# Patient Record
Sex: Female | Born: 1990 | Race: White | Hispanic: No | Marital: Married | Smoking: Never smoker
Health system: Southern US, Community
[De-identification: ages and names within clinical notes are randomized; demographics above are authoritative.]

## PROBLEM LIST (undated history)

## (undated) DIAGNOSIS — A6009 Herpesviral infection of other urogenital tract: Secondary | ICD-10-CM

## (undated) HISTORY — PX: NO PAST SURGERIES: SHX2092

## (undated) HISTORY — PX: WISDOM TOOTH EXTRACTION: SHX21

---

## 2019-05-15 DIAGNOSIS — F411 Generalized anxiety disorder: Secondary | ICD-10-CM | POA: Insufficient documentation

## 2019-06-26 IMAGING — CR DG KNEE COMPLETE 4+V*R*
1 series · 4 of 4 positions shown · non-contrast
Comparison: None.

CLINICAL DATA: Right knee pain.

EXAM:
RIGHT KNEE - COMPLETE 4+ VIEW

[Series 1: dg knee complete 4 views right · 0.14mm/px · 4 of 4 slices shown]
[im 1/4]
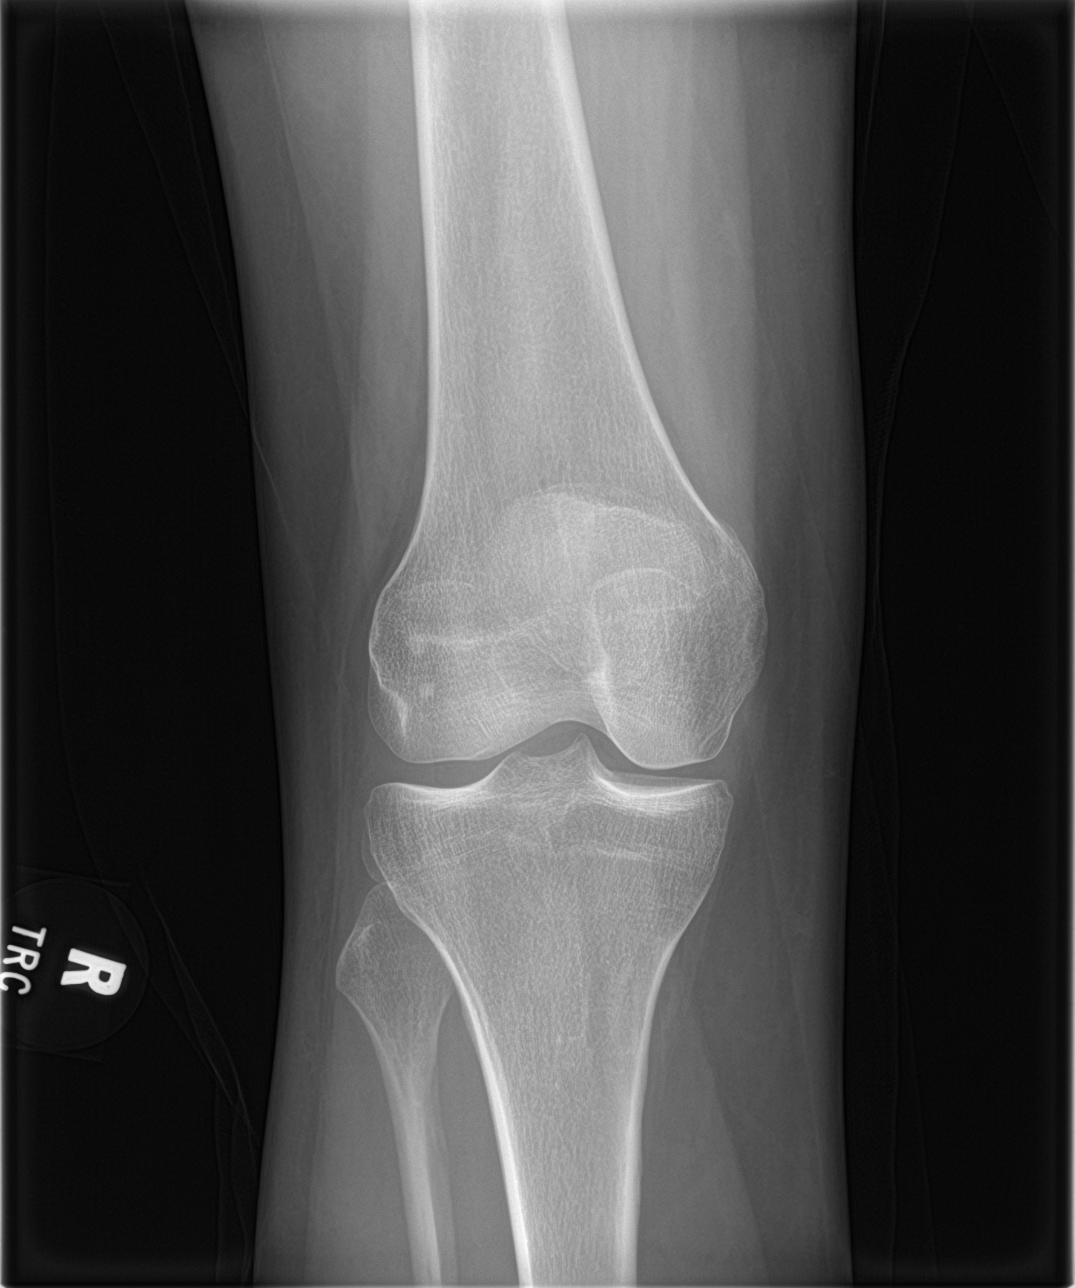
[im 2/4]
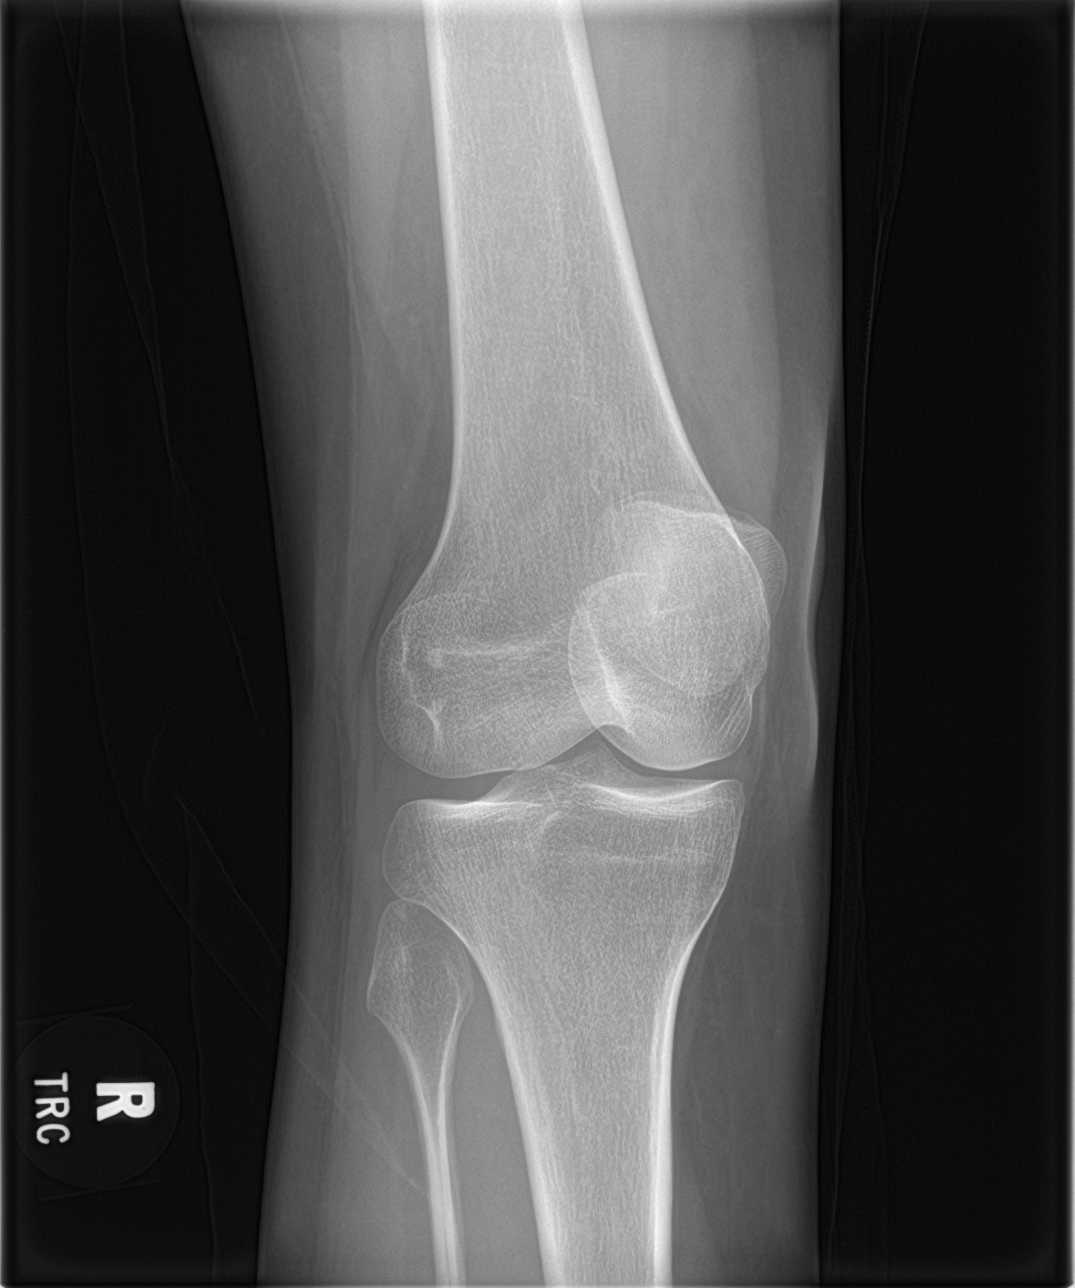
[im 3/4]
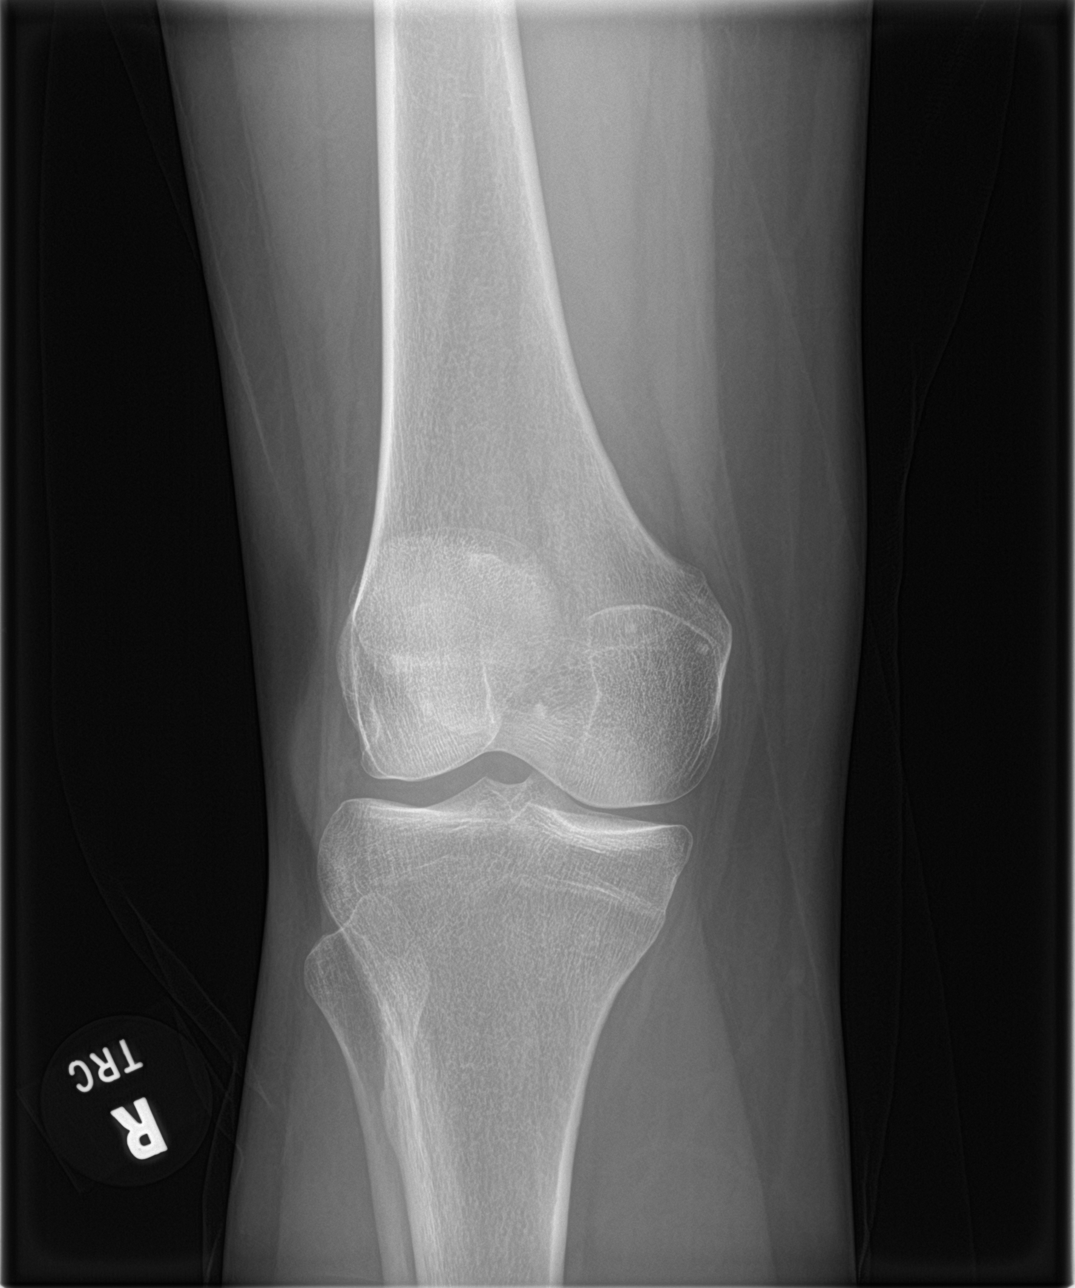
[im 4/4]
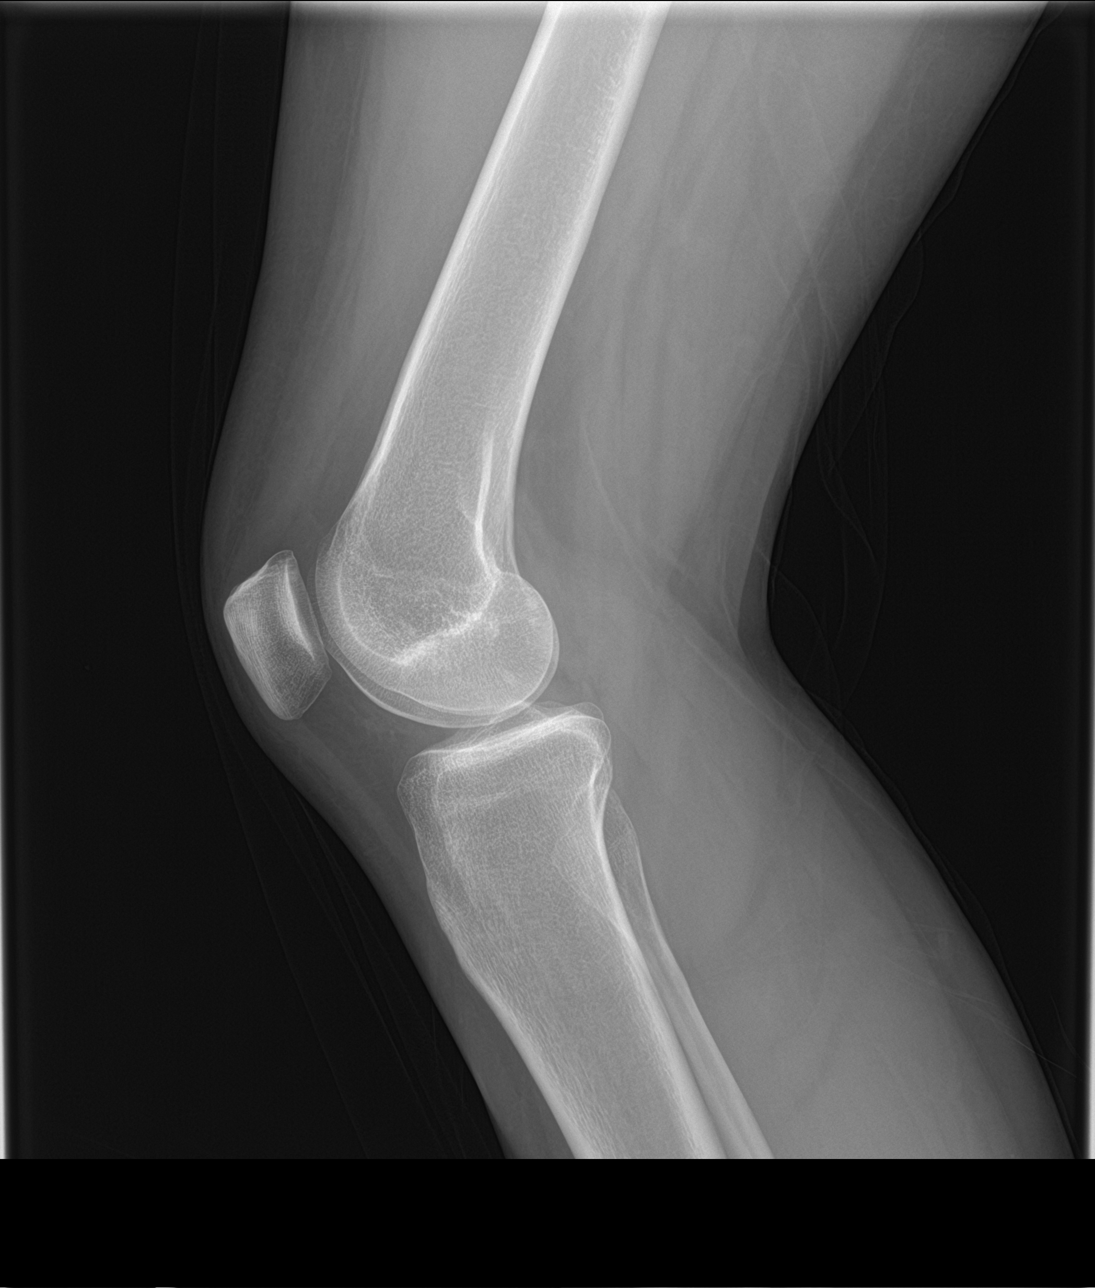

[4 of 4 positions shown; findings below may reference images not displayed]

FINDINGS: No evidence of fracture, dislocation, or joint effusion. No evidence
of arthropathy or other focal bone abnormality. Soft tissues are
unremarkable.
IMPRESSION: Negative.

## 2020-05-08 ENCOUNTER — Other Ambulatory Visit
Admission: RE | Admit: 2020-05-08 | Discharge: 2020-05-08 | Disposition: A | Discharge status: Home / Self Care | Payer: Federal, State, Local not specified - PPO | Source: Ambulatory Visit | Attending: Pediatrics | Admitting: Pediatrics

## 2020-05-08 DIAGNOSIS — M546 Pain in thoracic spine: Secondary | ICD-10-CM | POA: Insufficient documentation

## 2020-05-08 LAB — D-DIMER, QUANTITATIVE: D-Dimer, Quant: 0.32 ug/mL-FEU (ref 0.00–0.50)

## 2020-08-20 ENCOUNTER — Other Ambulatory Visit: Payer: Self-pay | Admitting: Family Medicine

## 2020-08-20 DIAGNOSIS — M5441 Lumbago with sciatica, right side: Secondary | ICD-10-CM

## 2020-08-20 DIAGNOSIS — M5414 Radiculopathy, thoracic region: Secondary | ICD-10-CM

## 2020-08-20 DIAGNOSIS — M5134 Other intervertebral disc degeneration, thoracic region: Secondary | ICD-10-CM

## 2020-08-27 ENCOUNTER — Ambulatory Visit
Admission: RE | Admit: 2020-08-27 | Discharge: 2020-08-27 | Disposition: A | Discharge status: Home / Self Care | Payer: Federal, State, Local not specified - PPO | Source: Ambulatory Visit | Attending: Family Medicine | Admitting: Family Medicine

## 2020-08-27 ENCOUNTER — Other Ambulatory Visit: Payer: Self-pay

## 2020-08-27 DIAGNOSIS — M5134 Other intervertebral disc degeneration, thoracic region: Secondary | ICD-10-CM

## 2020-08-27 DIAGNOSIS — M5414 Radiculopathy, thoracic region: Secondary | ICD-10-CM

## 2020-08-27 DIAGNOSIS — G8929 Other chronic pain: Secondary | ICD-10-CM

## 2020-08-27 IMAGING — MR MR LUMBAR SPINE W/O CM
4 of 5 series · 26 of 48 positions shown · non-contrast
Comparison: Report from radiographs of the lumbar spine [DATE]
(images unavailable).

CLINICAL DATA: Chronic right-sided low back pain with right-sided
sciatica. Additional history provided: Patient reports sharp pain on
left side of back for 4-5 months. Right leg and buttock numbness.

EXAM:
MRI LUMBAR SPINE WITHOUT CONTRAST
TECHNIQUE: Multiplanar, multisequence MR imaging of the lumbar spine was
performed. No intravenous contrast was administered.

[Series 3: T2 · sagittal · 4.0mm · 1.09mm/px · 6 of 17 slices shown (1 of 2)]
[im 1/17]
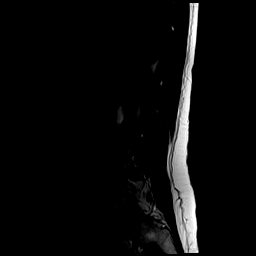
[im 4/17]
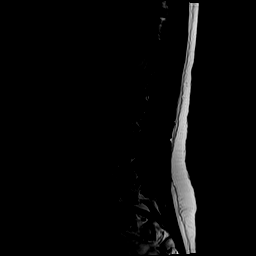
[im 7/17]
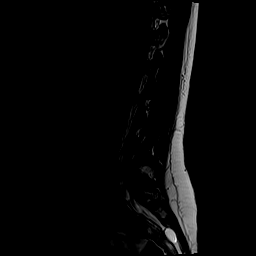
[im 10/17]
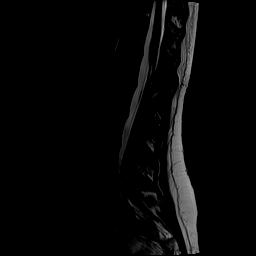
[im 13/17]
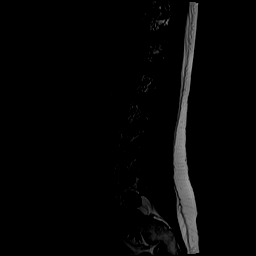
[im 17/17]
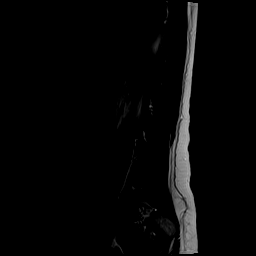

[Series 5: T1 · sagittal · 4.0mm · 1.09mm/px · 6 of 17 slices shown (1 of 2)]
[im 1/17]
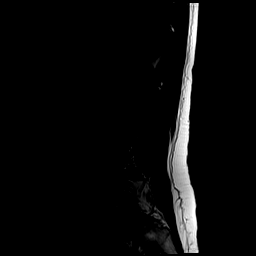
[im 4/17]
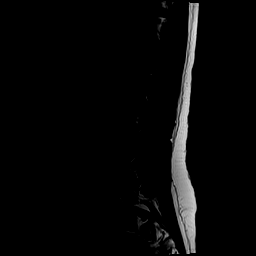
[im 7/17]
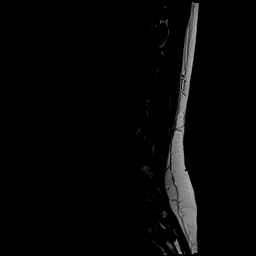
[im 10/17]
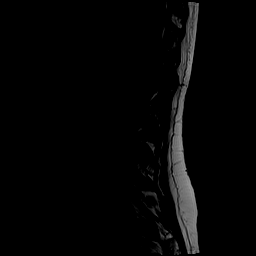
[im 13/17]
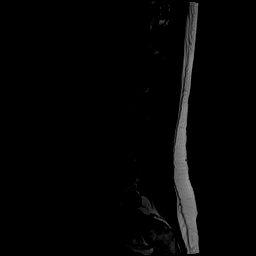
[im 17/17]
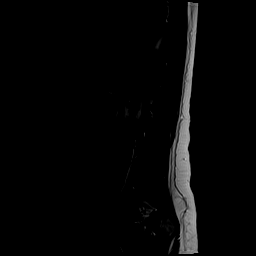

[Series 6: T2 · axial · 4.0mm · 0.39mm/px · z∈[-44,+181]mm · 9 of 42 slices shown (2 of 2)]
[im 1/42]
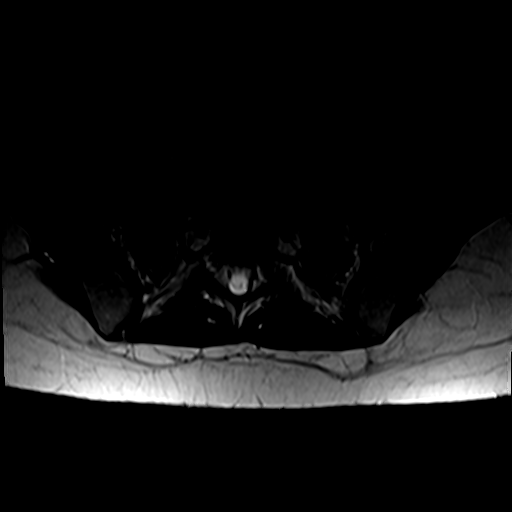
[im 6/42]
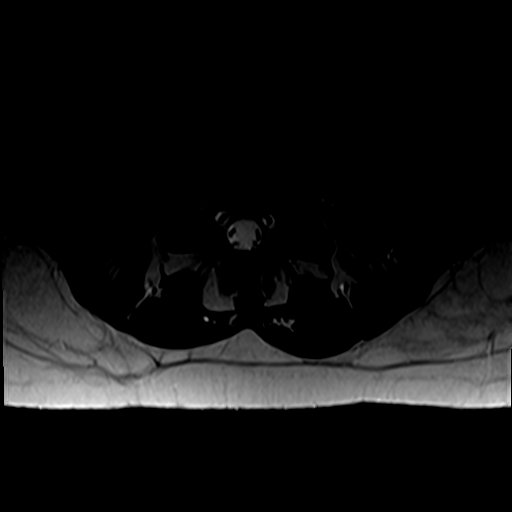
[im 12/42]
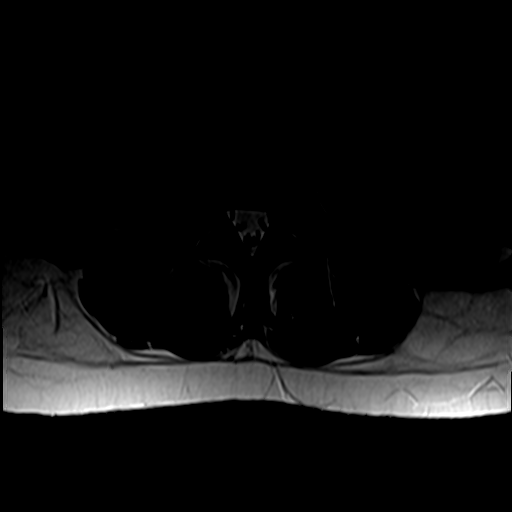
[im 18/42]
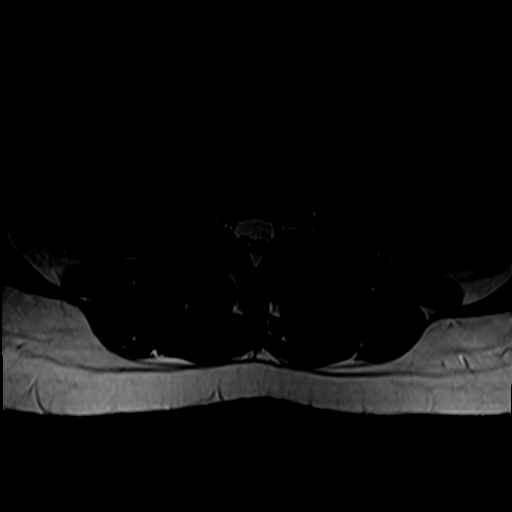
[im 21/42]
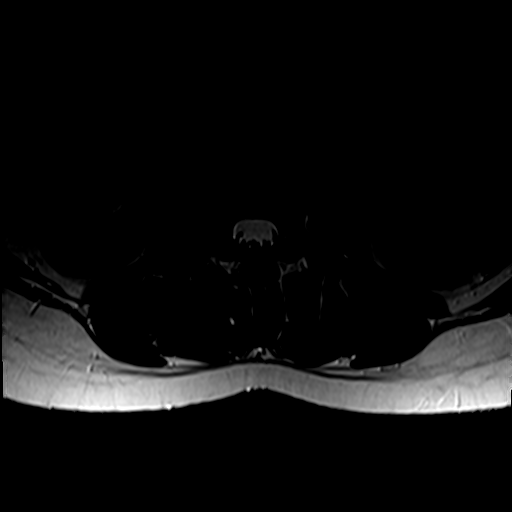
[im 24/42]
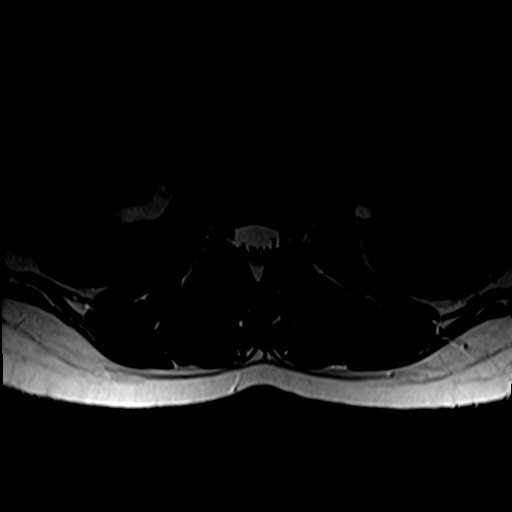
[im 30/42]
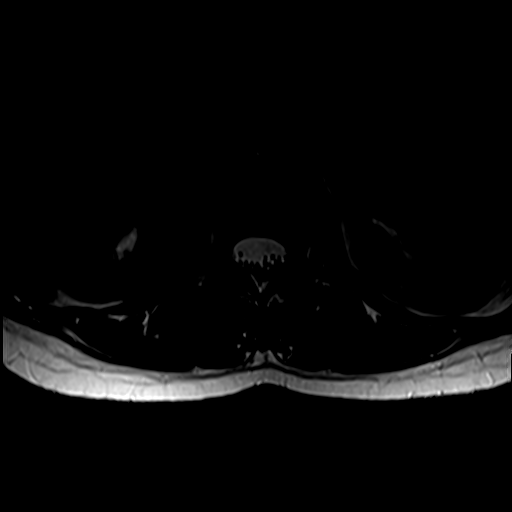
[im 36/42]
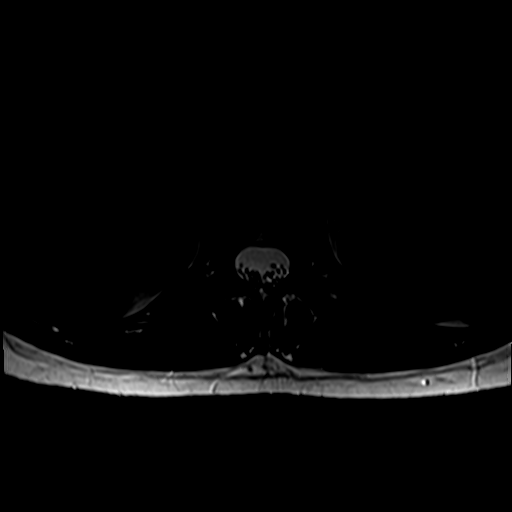
[im 42/42]
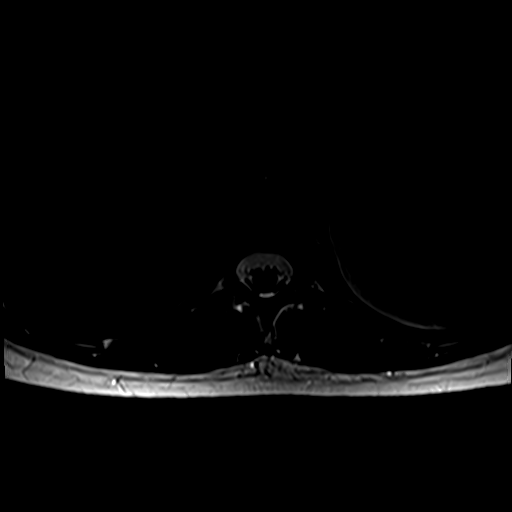

[Series 7: T1 · axial · 4.0mm · 0.39mm/px · z∈[-44,+152]mm · 5 of 42 slices shown (2 of 2)]
[im 1/42]
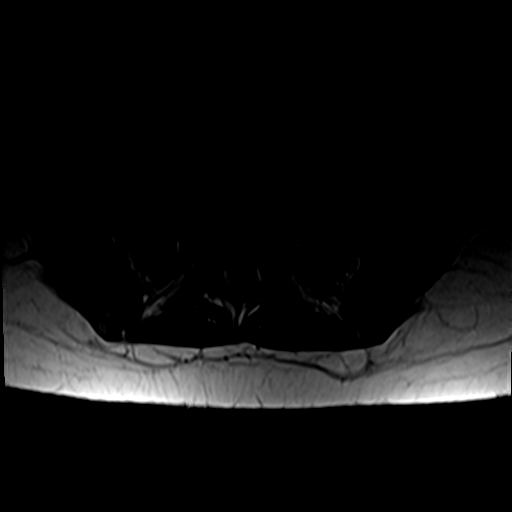
[im 6/42]
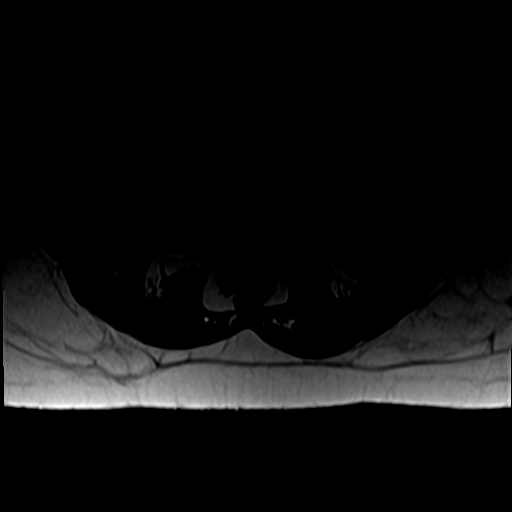
[im 12/42]
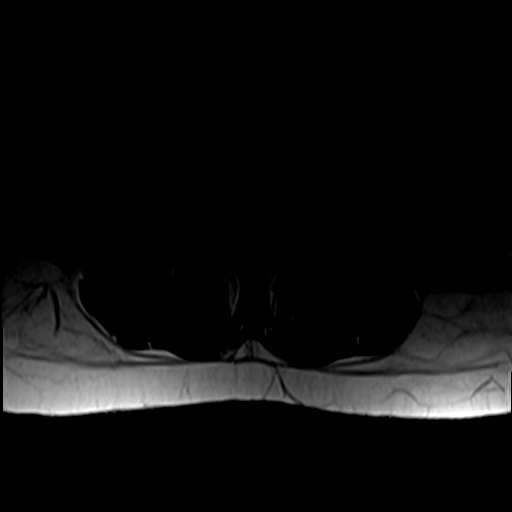
[im 21/42]
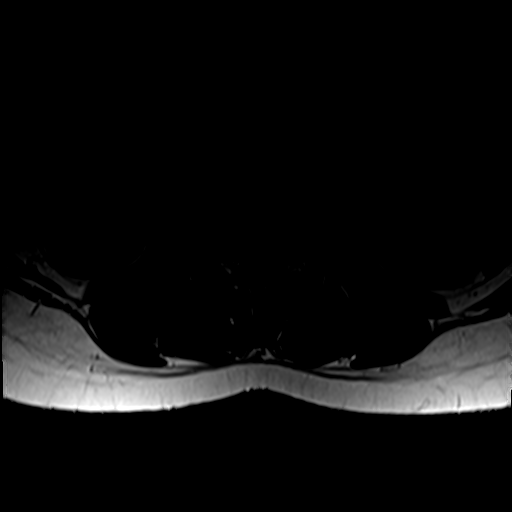
[im 36/42]
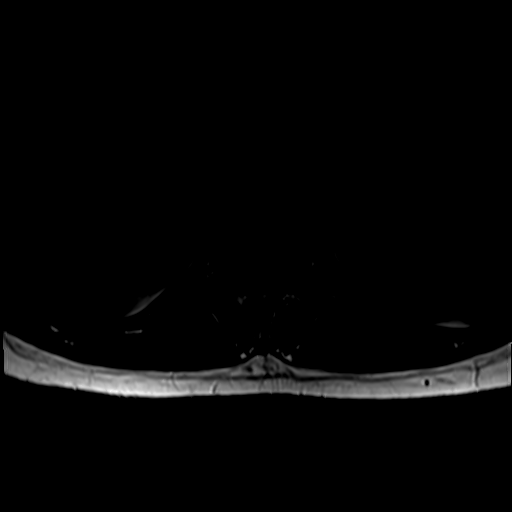

[26 of 48 positions shown; findings below may reference images not displayed]

FINDINGS: Segmentation: For the purposes of this dictation, five lumbar
vertebrae are assumed and the caudal most well-formed intervertebral
disc is designated L5-S1.

Alignment: Mild lumbar dextrocurvature, possibly positional. Trace
L1-L2, L2-L3 and L3-L4 grade 1 retrolisthesis.

Vertebrae: Vertebral body height is maintained. No significant
marrow edema or focal suspicious osseous lesion. Focal fat versus
small hemangioma within the L4 vertebral body.

Conus medullaris and cauda equina: Conus extends to the L1 level. No
signal abnormality within the visualized distal spinal cord.

Paraspinal and other soft tissues: No abnormality identified within
included portions of the abdomen/retroperitoneum. Paraspinal soft
tissues within normal limits.

Disc levels:

Intervertebral disc height is maintained throughout the lumbar
spine.

T12-L1: No significant disc herniation or stenosis.

L1-L2: Trace grade 1 retrolisthesis. No significant disc herniation
or stenosis.

L2-L3: Trace grade 1 retrolisthesis. No significant disc herniation
or stenosis.

L3-L4: Trace grade 1 retrolisthesis. Minimal facet arthrosis and
ligamentum flavum hypertrophy. Minimal relative left subarticular
narrowing without nerve root impingement. Central canal patent. No
significant foraminal stenosis.

L4-L5: Minimal facet arthrosis and ligamentum flavum hypertrophy.
Minimal relative left subarticular narrowing without nerve root
impingement. Central canal patent. No significant foraminal
stenosis.

L5-S1: Minimal facet arthrosis. No significant disc herniation or
stenosis.
IMPRESSION: Mild lumbar spondylosis, as outlined. Minimal facet arthrosis and
ligamentum flavum hypertrophy contribute to minimal relative left
subarticular narrowing at L3-L4 and L4-L5 (without nerve root
impingement). No significant central canal stenosis. Significant
foraminal narrowing.

Trace grade 1 retrolisthesis at L1-L2, L2-L3 and L3-L4.

Slight lumbar dextrocurvature, possibly positional.

## 2020-08-27 IMAGING — MR MR THORACIC SPINE W/O CM
4 of 6 series · 21 of 48 positions shown · non-contrast
Comparison: Report from radiograph of the chest and left ribs
[DATE] (images unavailable).

CLINICAL DATA: Thoracic radiculitis. Degenerative disc disease,
thoracic. Additional history provided by scanning technologist:
Patient reports 5 months of left mid back pain.

EXAM:
MRI THORACIC SPINE WITHOUT CONTRAST
TECHNIQUE: Multiplanar, multisequence MR imaging of the thoracic spine was
performed. No intravenous contrast was administered.

[Series 17: T1 · sagittal · 3.0mm · 0.83mm/px · 3 of 15 slices shown]
[im 1/15]
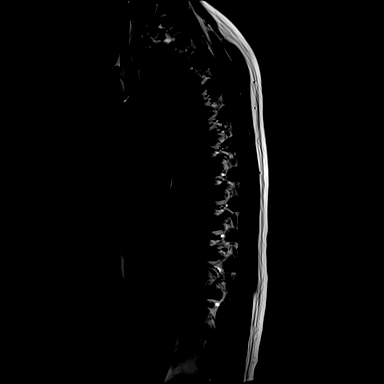
[im 8/15]
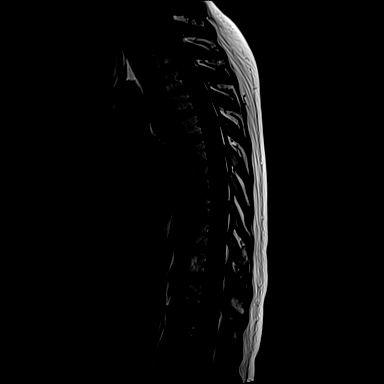
[im 15/15]
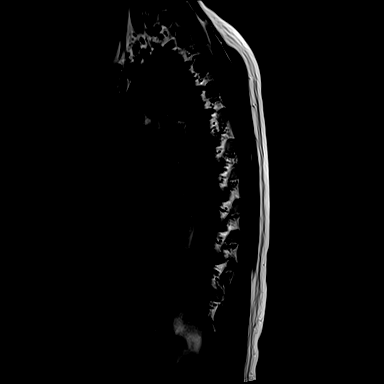

[Series 19: STIR · sagittal · 3.0mm · 1.00mm/px · 3 of 15 slices shown]
[im 3/15]
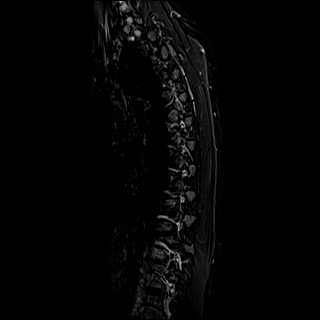
[im 9/15]
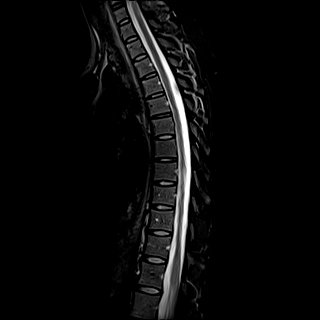
[im 15/15]
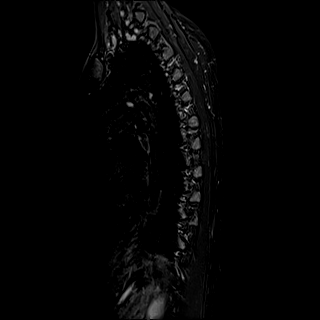

[Series 20: T2 · sagittal · 3.0mm · 0.83mm/px · 6 of 15 slices shown (1 of 2)]
[im 1/15]
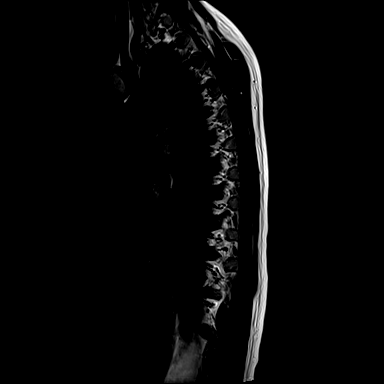
[im 3/15]
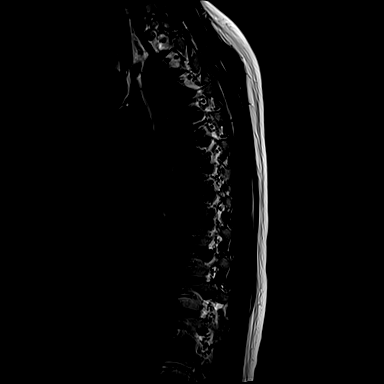
[im 6/15]
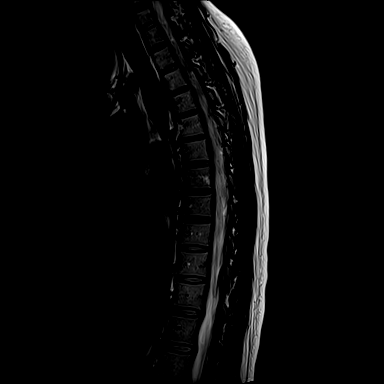
[im 9/15]
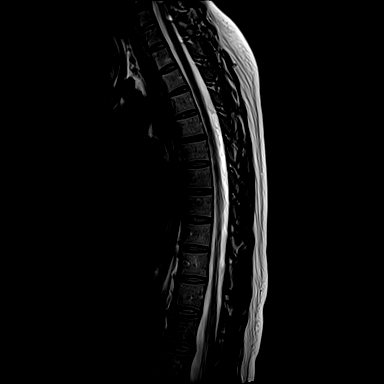
[im 12/15]
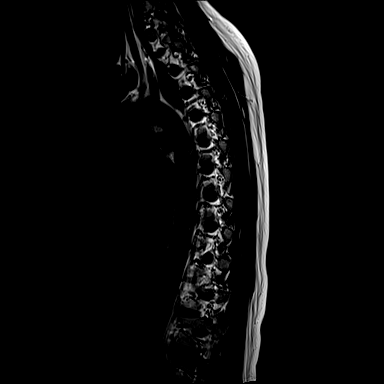
[im 15/15]
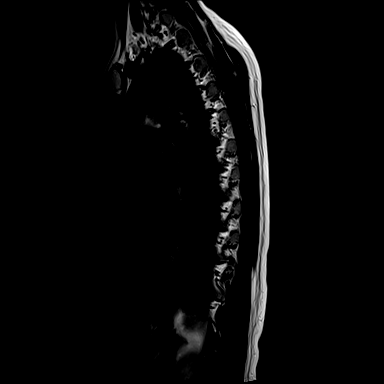

[Series 21: T2 · axial · 4.0mm · 0.28mm/px · z∈[-261,-66]mm · 9 of 33 slices shown (2 of 2)]
[im 1/33]
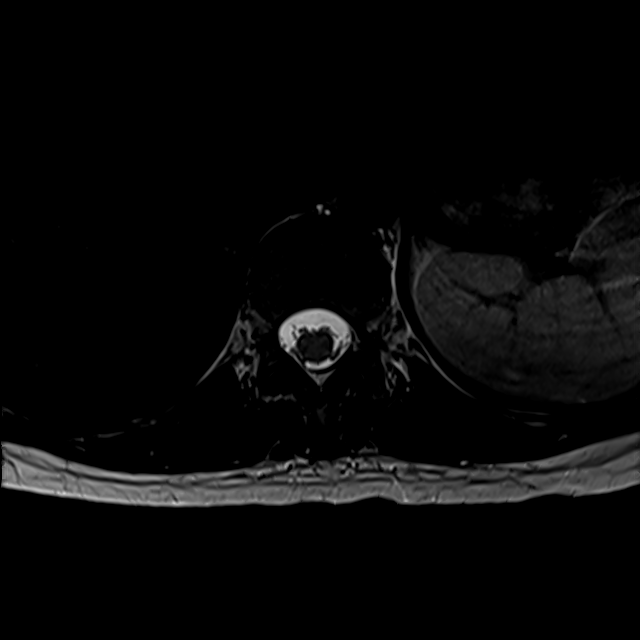
[im 6/33]
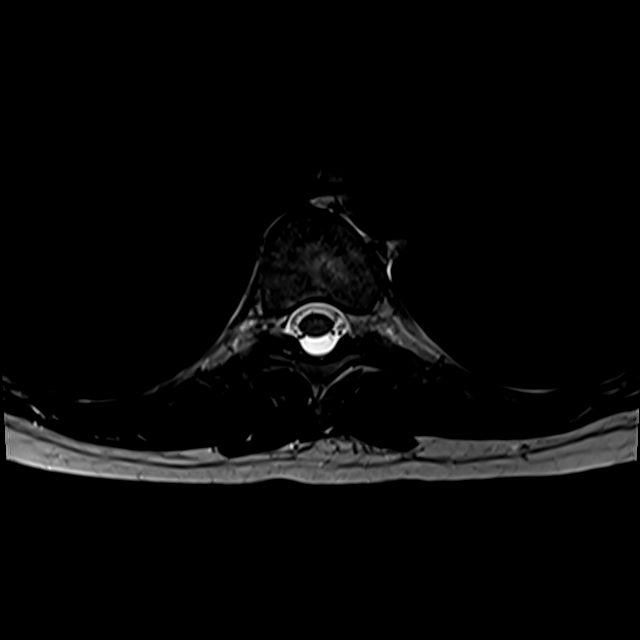
[im 11/33]
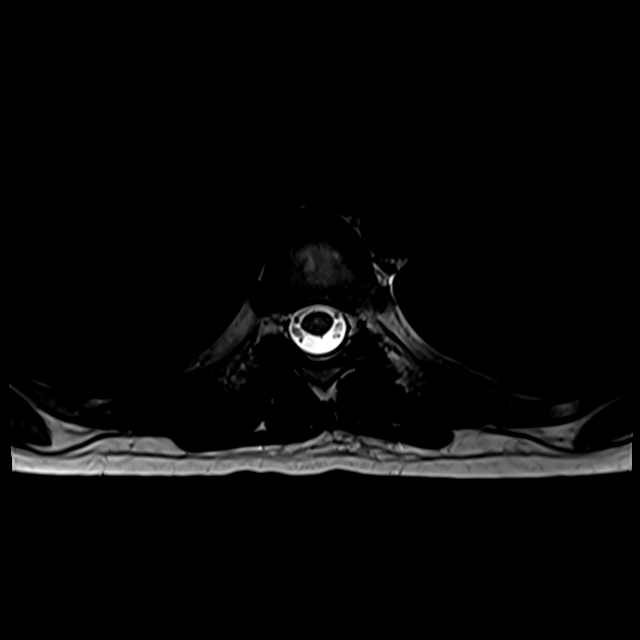
[im 14/33]
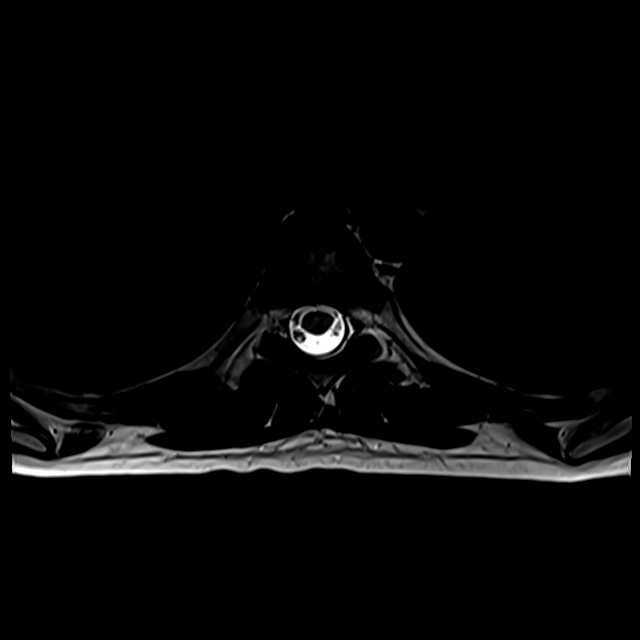
[im 17/33]
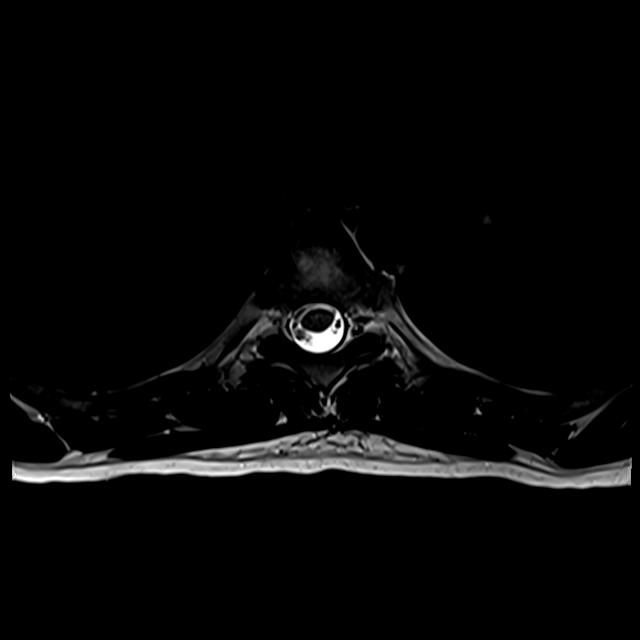
[im 19/33]
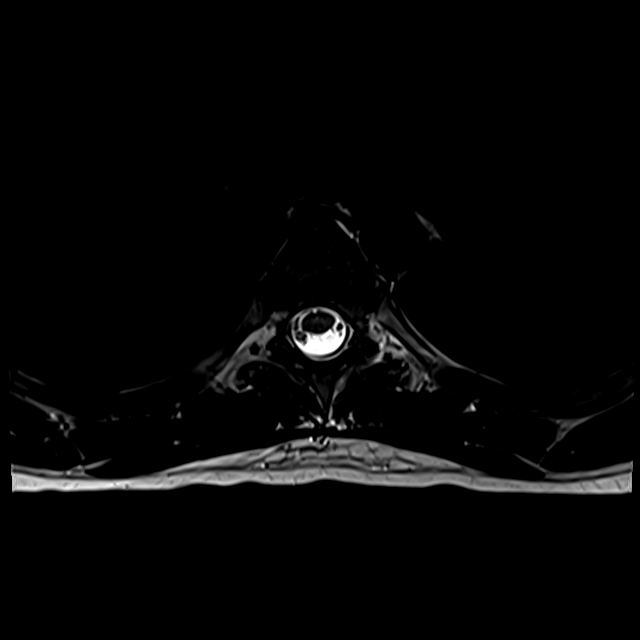
[im 22/33]
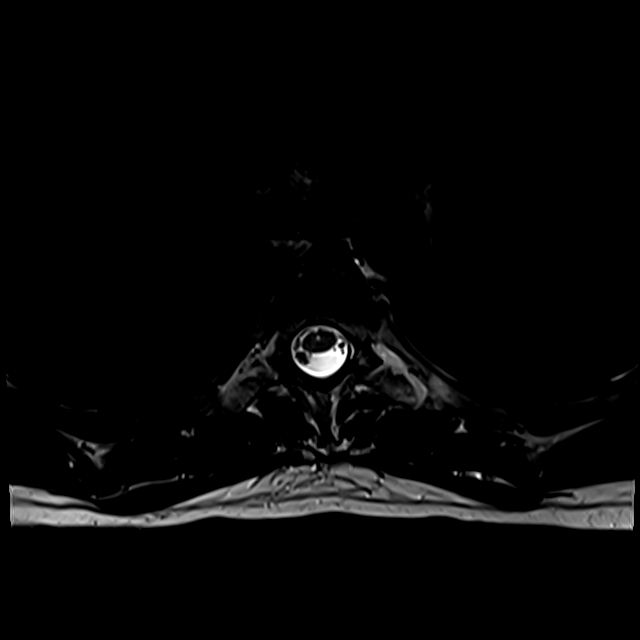
[im 27/33]
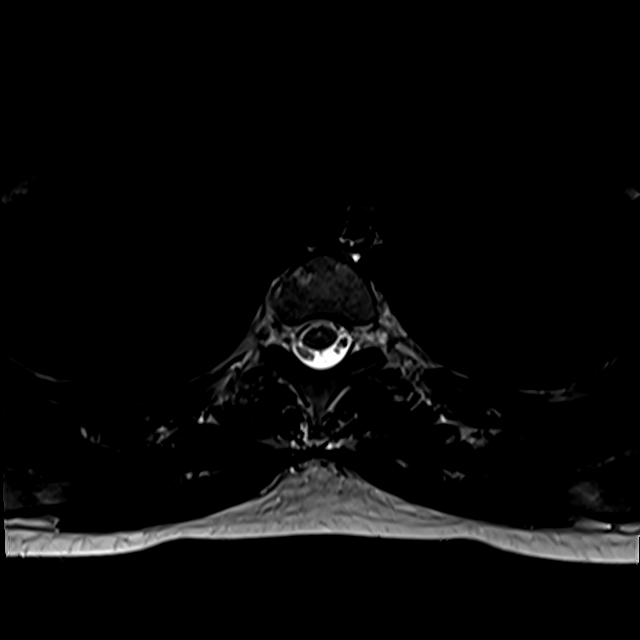
[im 33/33]
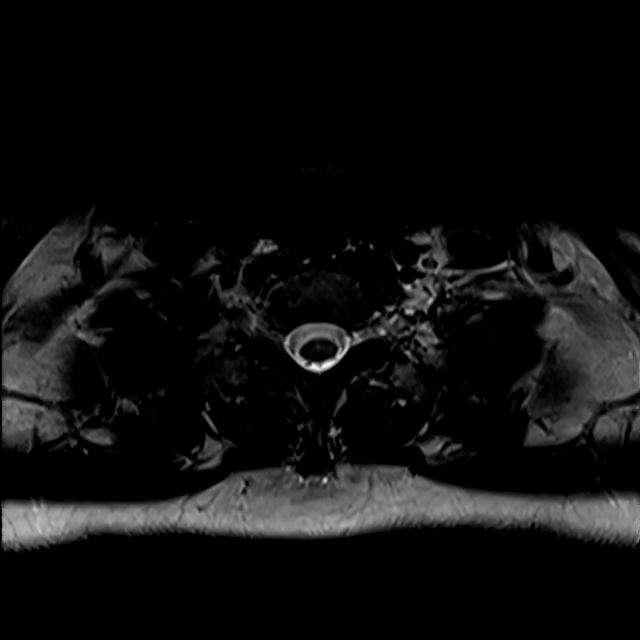

[21 of 48 positions shown; findings below may reference images not displayed]

FINDINGS: Alignment:  No significant spondylolisthesis.

Vertebrae: Vertebral body height is maintained. No significant
marrow edema or focal suspicious osseous lesion. T10 vertebral body
hemangioma.

Cord: Minimal T2 hyperintensity within the central spinal cord
(measuring 1 mm in width), likely reflecting slight prominence of
the central canal (within normal limits).

Paraspinal and other soft tissues: No abnormality identified within
included portions of the thorax or upper abdomen/retroperitoneum.

Disc levels:

Minimal multilevel disc degeneration, most notably at T6-T7 and
T7-T8. Tiny central disc protrusions at T1-T2 and T7-T8. No
significant spinal canal or foraminal stenosis.
IMPRESSION: Minimal thoracic spondylosis, as described. Most notably, there are
tiny central disc protrusions at T6-T7 and T7-T8. No significant
spinal canal or foraminal stenosis.

## 2020-08-31 ENCOUNTER — Other Ambulatory Visit: Payer: Federal, State, Local not specified - PPO

## 2021-02-25 ENCOUNTER — Other Ambulatory Visit: Payer: Self-pay

## 2021-02-25 ENCOUNTER — Emergency Department: Payer: Federal, State, Local not specified - PPO

## 2021-02-25 ENCOUNTER — Encounter: Payer: Self-pay | Admitting: *Deleted

## 2021-02-25 DIAGNOSIS — Z5321 Procedure and treatment not carried out due to patient leaving prior to being seen by health care provider: Secondary | ICD-10-CM | POA: Insufficient documentation

## 2021-02-25 DIAGNOSIS — M25561 Pain in right knee: Secondary | ICD-10-CM | POA: Diagnosis present

## 2021-02-25 NOTE — ED Triage Notes (Signed)
Pt reports pain in right knee.  No known injury.  Pt reports pain radiating into upper leg.  No swelling.  Pt alert  speech clear.

## 2021-02-26 ENCOUNTER — Emergency Department
Admission: EM | Admit: 2021-02-26 | Discharge: 2021-02-26 | Disposition: A | Discharge status: Home / Self Care | Payer: Federal, State, Local not specified - PPO | Attending: Student in an Organized Health Care Education/Training Program | Admitting: Student in an Organized Health Care Education/Training Program

## 2021-02-26 NOTE — ED Notes (Signed)
No answer when called several times from lobby 

## 2021-03-01 NOTE — L&D Delivery Note (Signed)
Operative Delivery Note At 6:40 PM a viable female was delivered via Vaginal, Vacuum Neurosurgeon) assistance.  Presentation: compound; Position: Occiput,, Anterior; Station: +4.  Verbal consent: obtained from patient.  Risks and benefits discussed in detail.  Risks include, but are not limited to the risks of anesthesia, bleeding, infection, damage to maternal tissues, fetal cephalhematoma.  There is also the risk of inability to effect vaginal delivery of the head, or shoulder dystocia that cannot be resolved by established maneuvers, leading to the need for emergency cesarean section.    APGAR: 7, 9; weight pending .   Placenta status: delivered, spontaneously, intact.   Cord: 3VC  with the following complications: none.  Cord pH: n/a  Anesthesia:  epidural Instruments: Kiwi flat Episiotomy: None Lacerations: Perineal;2nd degree;Vaginal Suture Repair: 3.0 vicryl Est. Blood Loss (mL):  700  Mom to postpartum.  Baby to Couplet care / Skin to Skin.  Called to see patient.  Mom pushed to deliver a viable female infant.  Due to maternal exhaustion and then request, after consent and notifying RNs, Peds, Anesthesia, the Kiwi flat was applied according to the standard procedure.  During one contraction with 2 gentle pulls the head was crowning. The vacuum was then removed.  Mom went on the push and with the help of a Ritgen maneuver the head followed by shoulders, which delivered without difficulty, and the rest of the body.  There was a compound hand presentation.  No nuchal cord noted.  Baby to mom's chest.  Cord clamped and cut after > 1 min delay.  No cord blood obtained.  Placenta delivered spontaneously, intact, with a 3-vessel cord.  Second degree perineal laceration repaired with 3-0 Vicryl in standard fashion.  All counts correct.  Hemostasis obtained with IV pitocin and fundal massage. EBL 700 mL.  Quantitative blood loss is still pending.  Prentice Docker, MD 12/16/2021, 7:40 PM

## 2021-04-17 ENCOUNTER — Encounter (HOSPITAL_COMMUNITY): Payer: Self-pay

## 2021-04-17 ENCOUNTER — Ambulatory Visit (HOSPITAL_COMMUNITY)
Admission: EM | Admit: 2021-04-17 | Discharge: 2021-04-17 | Disposition: A | Discharge status: Home / Self Care | Payer: Federal, State, Local not specified - PPO | Attending: Internal Medicine | Admitting: Internal Medicine

## 2021-04-17 DIAGNOSIS — J019 Acute sinusitis, unspecified: Secondary | ICD-10-CM | POA: Diagnosis not present

## 2021-04-17 NOTE — Discharge Instructions (Signed)
Saline nasal spray as needed Take tylenol as needed for pain. Humidifier and vaporub use helps with nasal congestion Please reach out to your fertility specialist about antibiotic use for sinusitis in the setting of IVF pregnancy.

## 2021-04-17 NOTE — ED Provider Notes (Signed)
[facility]    CSN: [id number] Arrival date & time: 04/17/21  1840      History   Chief Complaint Chief Complaint  Patient presents with   URI    HPI Michele Mayo is a 31 y.o. female comes to the urgent care with nasal congestion, sore throat, postnasal drainage, left ear fullness and left-sided facial pain.  Patient's symptoms started with runny nose, nasal congestion and a clear rhinorrhea 2 weeks ago.  Patient started experiencing left-sided headache over the past several days.  Nasal discharge has become purulent over the past 3 to 4 days.  She describes the headache as unilateral, persistent, transiently relieved by Tylenol.  She denies any nausea or vomiting.  No floaters in the visual field.  No history of migraines.  Patient is currently undergoing IVF treatment and she just found out that she is pregnant.  She spoke with her IVF specialist who recommended only Tylenol at this time.  The IVF specialist will evaluate the patient for implantation and hence did not want her to have any medications including antibiotics.Marland Kitchen   HPI  History reviewed. No pertinent past medical history.  There are no problems to display for this patient.   History reviewed. No pertinent surgical history.  OB History     Gravida  1   Para      Term      Preterm      AB      Living         SAB      IAB      Ectopic      Multiple      Live Births               Home Medications    Prior to Admission medications   Medication Sig Start Date End Date Taking? Authorizing Provider  aspirin 81 MG chewable tablet Chew 81 mg by mouth daily.   Yes [provider]  estradiol (ESTRACE) 2 MG tablet Take 2 mg by mouth daily.   Yes [provider]  estradiol (VIVELLE-DOT) 0.1 MG/24HR patch Place 1 patch onto the skin 2 (two) times a week.   Yes [provider]  Prenatal Vit-Fe Fumarate-FA (PRENATAL MULTIVITAMIN) TABS tablet Take 1 tablet  by mouth daily at 12 noon.   Yes [provider]  progesterone 50 MG/ML injection Inject 10 mg into the muscle daily.   Yes [provider]    Family History Family History  Family history unknown: Yes    Social History Social History   Tobacco Use   Smoking status: Never   Smokeless tobacco: Never  Substance Use Topics   Alcohol use: Not Currently     Allergies   Patient has no known allergies.   Review of Systems Review of Systems  Constitutional: Negative.  Negative for chills, fatigue and fever.  HENT:  Positive for congestion, postnasal drip, rhinorrhea and sore throat. Negative for voice change.   Respiratory:  Positive for cough. Negative for shortness of breath and wheezing.   Cardiovascular: Negative.   Gastrointestinal: Negative.   Neurological:  Positive for headaches.    Physical Exam Triage Vital Signs ED Triage Vitals  Enc Vitals Group     BP 04/17/21 1932 122/77     Pulse Rate 04/17/21 1932 79     Resp 04/17/21 1932 18     Temp 04/17/21 1932 98.8 F (37.1 C)     Temp Source 04/17/21 1932 Oral  SpO2 04/17/21 1932 100 %     Weight --      Height --      Head Circumference --      Peak Flow --      Pain Score 04/17/21 1937 4     Pain Loc --      Pain Edu? --      Excl. in GC? --    No data found.  Updated Vital Signs BP 122/77 (BP Location: Left Arm)    Pulse 79    Temp 98.8 F (37.1 C) (Oral)    Resp 18    LMP 03/21/2021 (Approximate)    SpO2 100%   Visual Acuity Right Eye Distance:   Left Eye Distance:   Bilateral Distance:    Right Eye Near:   Left Eye Near:    Bilateral Near:     Physical Exam Vitals and nursing note reviewed.  Constitutional:      General: She is not in acute distress.    Appearance: Normal appearance. She is not ill-appearing.  HENT:     Right Ear: Tympanic membrane normal.     Left Ear: Tympanic membrane normal.     Ears:     Comments: Bilateral middle ear effusion    Mouth/Throat:      Mouth: Mucous membranes are moist.     Pharynx: Posterior oropharyngeal erythema present.  Cardiovascular:     Rate and Rhythm: Normal rate and regular rhythm.  Pulmonary:     Effort: Pulmonary effort is normal.     Breath sounds: Normal breath sounds.  Neurological:     Mental Status: She is alert.     UC Treatments / Results  Labs (all labs ordered are listed, but only abnormal results are displayed) Labs Reviewed - No data to display  EKG   Radiology No results found.  Procedures Procedures (including critical care time)  Medications Ordered in UC Medications - No data to display  Initial Impression / Assessment and Plan / UC Course  I have reviewed the triage vital signs and the nursing notes.  Pertinent labs & imaging results that were available during my care of the patient were reviewed by me and considered in my medical decision making (see chart for details).     1.  Acute sinusitis with symptoms greater than 10 days: Patient is advised to use vapor rub and humidifier to help with nasal congestion Saline nasal spray or nasal rinses will help with nasal congestion Ideally, I will prescribe antibiotics as the patient's symptoms is likely secondary to bacterial sinusitis.  In the setting of IVF treatment with recent positive pregnancy test, I will hold off prescribing any antibiotics.  I encouraged the patient to call the IVF specialist in the morning to cause prescription medications for her symptoms. Final Clinical Impressions(s) / UC Diagnoses   Final diagnoses:  Acute sinusitis with symptoms > 10 days     Discharge Instructions      Saline nasal spray as needed Take tylenol as needed for pain. Humidifier and vaporub use helps with nasal congestion Please reach out to your fertility specialist about antibiotic use for sinusitis in the setting of IVF pregnancy.   ED Prescriptions   None    PDMP not reviewed this encounter.   Merrilee Jansky, MD 04/17/21 2008

## 2021-04-17 NOTE — ED Triage Notes (Signed)
Pt presents with nasal congestion, sore throat, and sinus headache X 2 weeks.

## 2021-05-10 ENCOUNTER — Emergency Department: Payer: Federal, State, Local not specified - PPO

## 2021-05-10 ENCOUNTER — Encounter: Payer: Self-pay | Admitting: Emergency Medicine

## 2021-05-10 ENCOUNTER — Other Ambulatory Visit: Payer: Self-pay

## 2021-05-10 ENCOUNTER — Emergency Department
Admission: EM | Admit: 2021-05-10 | Discharge: 2021-05-10 | Disposition: A | Discharge status: Home / Self Care | Payer: Federal, State, Local not specified - PPO | Attending: Emergency Medicine | Admitting: Emergency Medicine

## 2021-05-10 DIAGNOSIS — O418X10 Other specified disorders of amniotic fluid and membranes, first trimester, not applicable or unspecified: Secondary | ICD-10-CM

## 2021-05-10 DIAGNOSIS — O418X11 Other specified disorders of amniotic fluid and membranes, first trimester, fetus 1: Secondary | ICD-10-CM | POA: Insufficient documentation

## 2021-05-10 DIAGNOSIS — O468X1 Other antepartum hemorrhage, first trimester: Secondary | ICD-10-CM

## 2021-05-10 DIAGNOSIS — N939 Abnormal uterine and vaginal bleeding, unspecified: Secondary | ICD-10-CM

## 2021-05-10 DIAGNOSIS — O209 Hemorrhage in early pregnancy, unspecified: Secondary | ICD-10-CM | POA: Diagnosis present

## 2021-05-10 DIAGNOSIS — Z3A01 Less than 8 weeks gestation of pregnancy: Secondary | ICD-10-CM | POA: Insufficient documentation

## 2021-05-10 LAB — CBC WITH DIFFERENTIAL/PLATELET
Abs Immature Granulocytes: 0.01 10*3/uL (ref 0.00–0.07)
Basophils Absolute: 0 10*3/uL (ref 0.0–0.1)
Basophils Relative: 0 %
Eosinophils Absolute: 0 10*3/uL (ref 0.0–0.5)
Eosinophils Relative: 1 %
HCT: 40.2 % (ref 36.0–46.0)
Hemoglobin: 13.3 g/dL (ref 12.0–15.0)
Immature Granulocytes: 0 %
Lymphocytes Relative: 30 %
Lymphs Abs: 1.2 10*3/uL (ref 0.7–4.0)
MCH: 29.2 pg (ref 26.0–34.0)
MCHC: 33.1 g/dL (ref 30.0–36.0)
MCV: 88.4 fL (ref 80.0–100.0)
Monocytes Absolute: 0.2 10*3/uL (ref 0.1–1.0)
Monocytes Relative: 5 %
Neutro Abs: 2.5 10*3/uL (ref 1.7–7.7)
Neutrophils Relative %: 64 %
Platelets: 175 10*3/uL (ref 150–400)
RBC: 4.55 MIL/uL (ref 3.87–5.11)
RDW: 12.6 % (ref 11.5–15.5)
WBC: 4 10*3/uL (ref 4.0–10.5)
nRBC: 0 % (ref 0.0–0.2)

## 2021-05-10 LAB — COMPREHENSIVE METABOLIC PANEL
ALT: 10 U/L (ref 0–44)
AST: 16 U/L (ref 15–41)
Albumin: 3.6 g/dL (ref 3.5–5.0)
Alkaline Phosphatase: 45 U/L (ref 38–126)
Anion gap: 7 (ref 5–15)
BUN: 8 mg/dL (ref 6–20)
CO2: 25 mmol/L (ref 22–32)
Calcium: 9 mg/dL (ref 8.9–10.3)
Chloride: 105 mmol/L (ref 98–111)
Creatinine, Ser: 0.54 mg/dL (ref 0.44–1.00)
GFR, Estimated: >60 mL/min (ref >60)
Glucose, Bld: 91 mg/dL (ref 70–99)
Potassium: 3.9 mmol/L (ref 3.5–5.1)
Sodium: 137 mmol/L (ref 135–145)
Total Bilirubin: 1 mg/dL (ref 0.3–1.2)
Total Protein: 7.1 g/dL (ref 6.5–8.1)

## 2021-05-10 LAB — URINALYSIS, COMPLETE (UACMP) WITH MICROSCOPIC
Bilirubin Urine: NEGATIVE
Glucose, UA: NEGATIVE mg/dL
Ketones, ur: 20 mg/dL — AB
Leukocytes,Ua: NEGATIVE
Nitrite: NEGATIVE
Protein, ur: NEGATIVE mg/dL
Specific Gravity, Urine: 1.025 (ref 1.005–1.030)
pH: 6 (ref 5.0–8.0)

## 2021-05-10 LAB — HCG, QUANTITATIVE, PREGNANCY: hCG, Beta Chain, Quant, S: 102139 m[IU]/mL — ABNORMAL HIGH (ref <5)

## 2021-05-10 LAB — ABO/RH: ABO/RH(D): A POS

## 2021-05-10 LAB — SAMPLE TO BLOOD BANK

## 2021-05-10 LAB — POC URINE PREG, ED: Preg Test, Ur: POSITIVE — AB

## 2021-05-10 IMAGING — US US OB < 14 WEEKS - US OB TV
1 series · 14 of 28 positions shown · non-contrast
Comparison: None.

CLINICAL DATA: Vaginal bleeding and first-trimester pregnancy

EXAM:
OBSTETRIC <14 WK US AND TRANSVAGINAL OB US
TECHNIQUE: Both transabdominal and transvaginal ultrasound examinations were
performed for complete evaluation of the gestation as well as the
maternal uterus, adnexal regions, and pelvic cul-de-sac.
Transvaginal technique was performed to assess early pregnancy.

[Series 1: us ob comp less 14 wks · 14 of 113 slices shown]
[im 5/113]
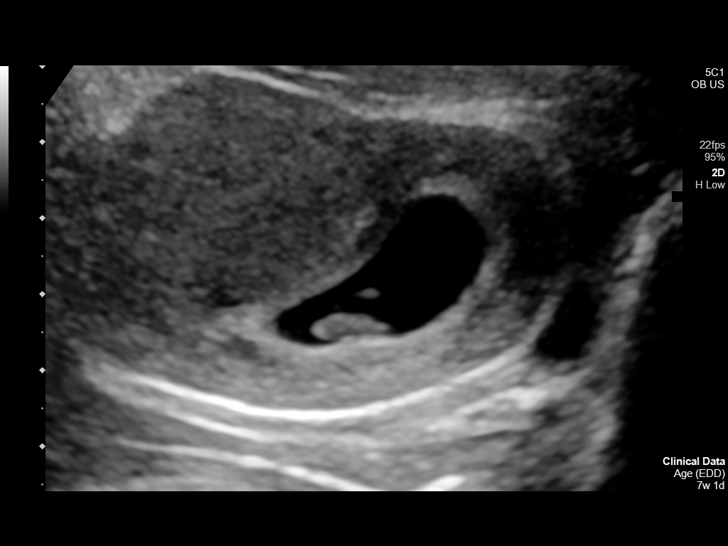
[im 13/113]
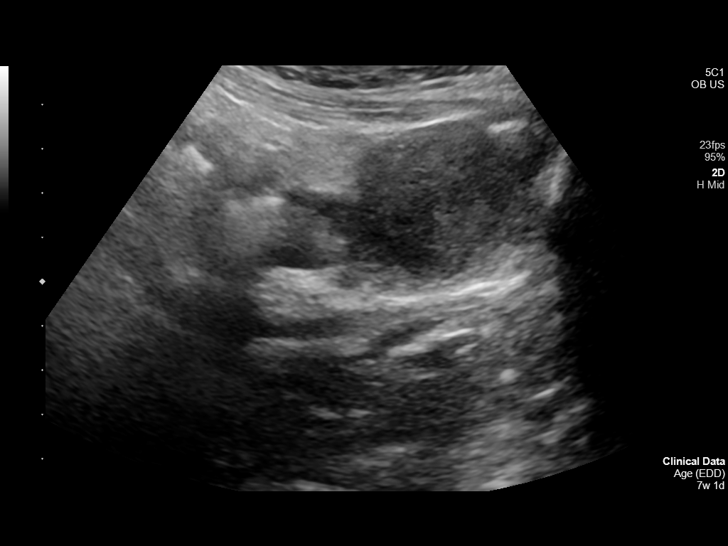
[im 21/113]
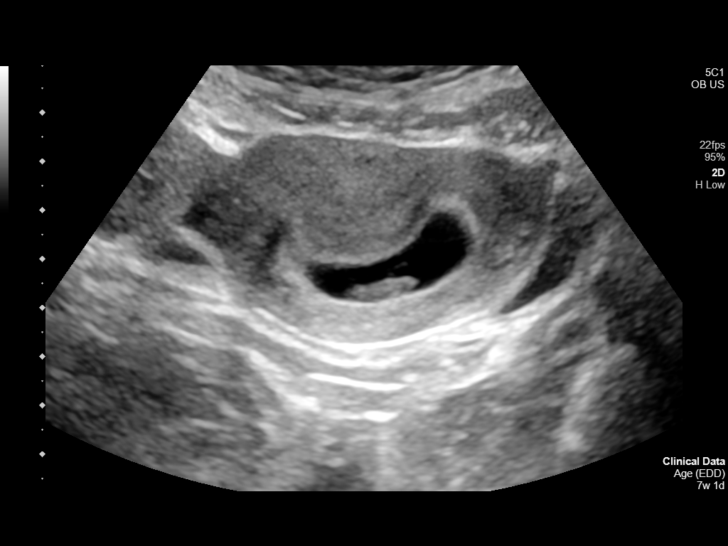
[im 30/113]
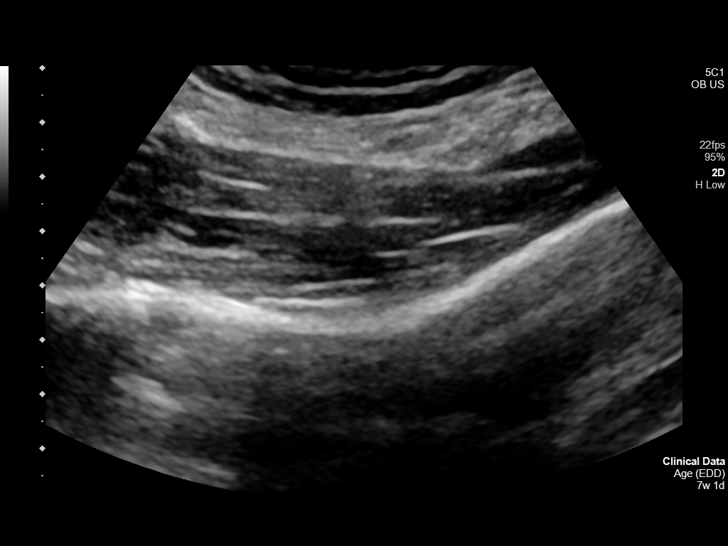
[im 38/113]
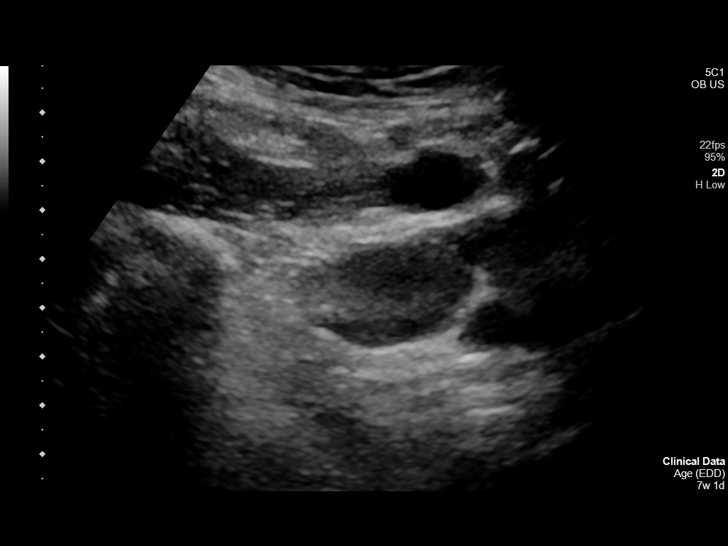
[im 46/113]
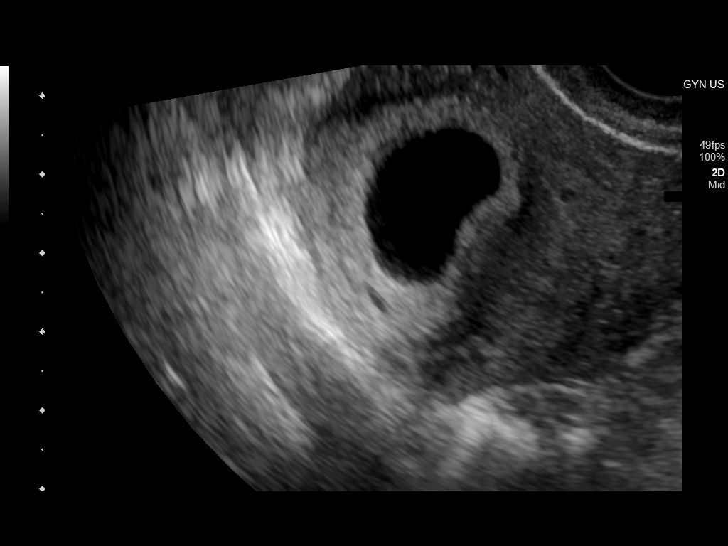
[im 54/113]
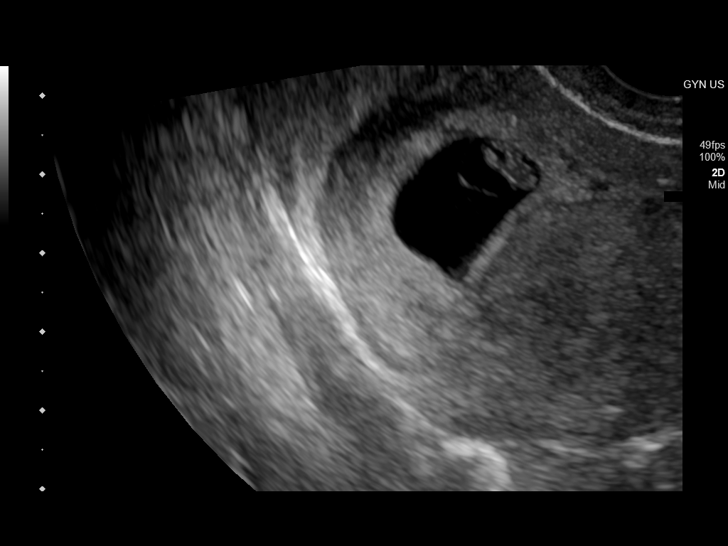
[im 63/113]
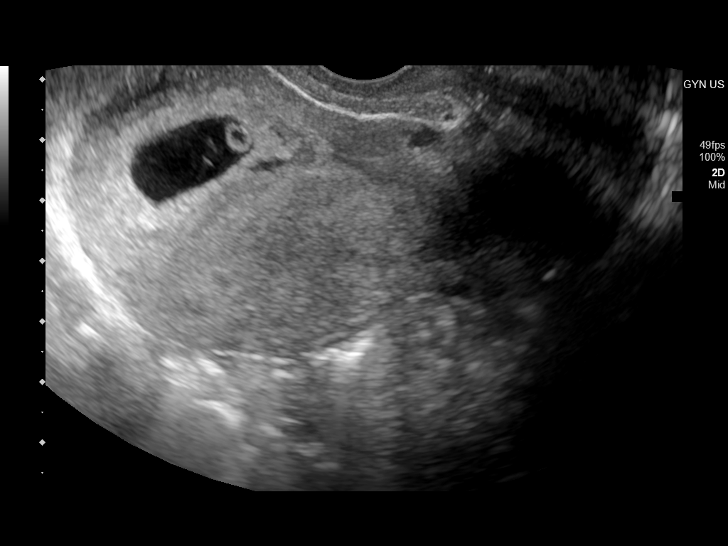
[im 71/113]
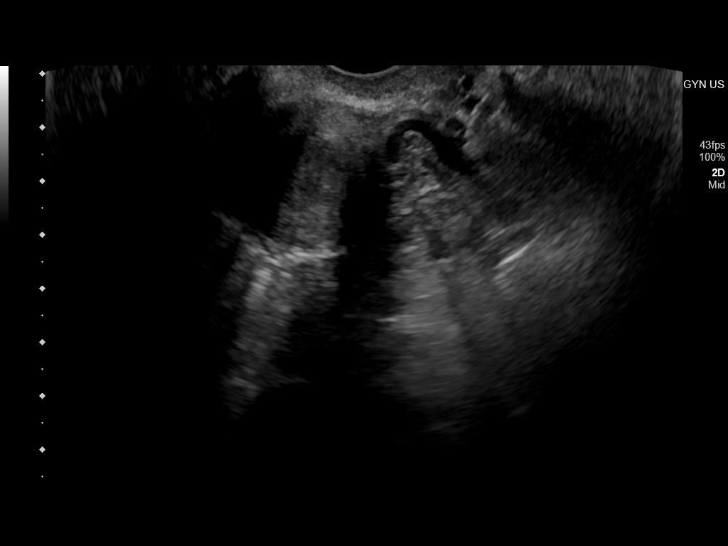
[im 79/113]
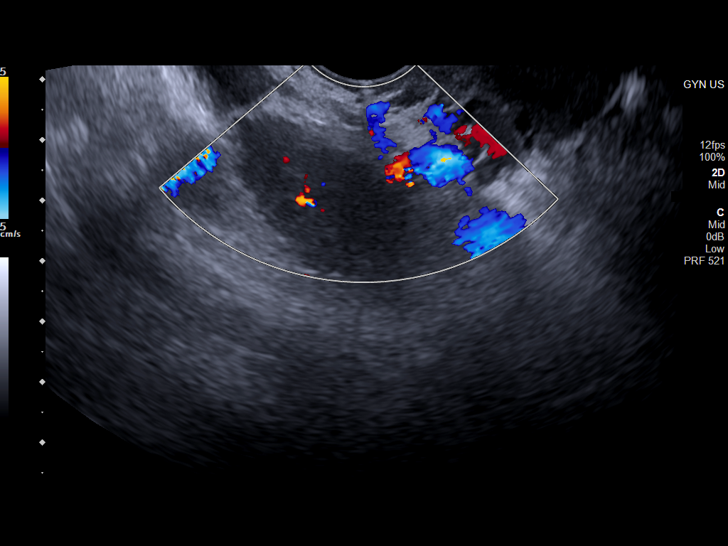
[im 88/113]
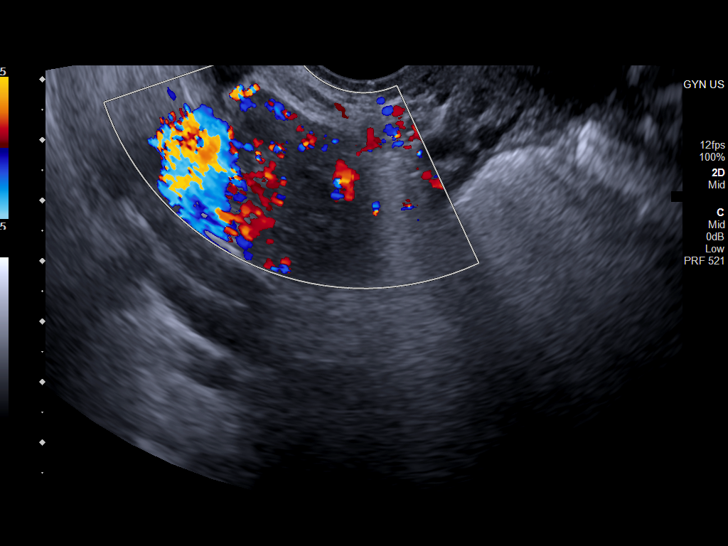
[im 96/113]
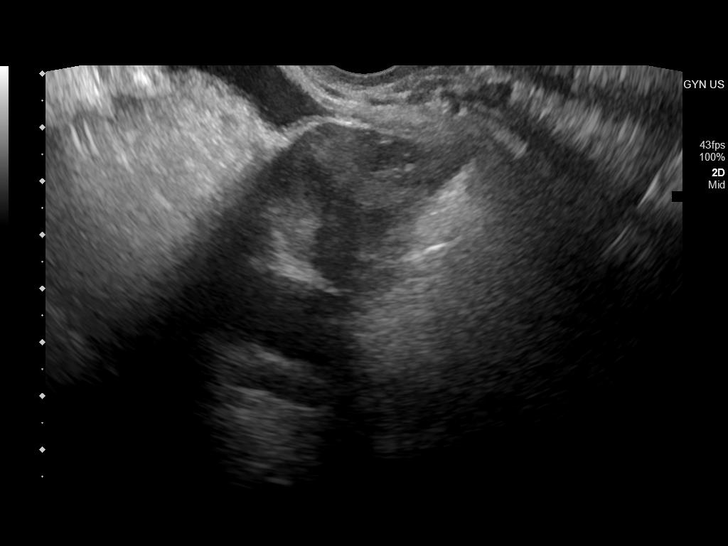
[im 104/113]
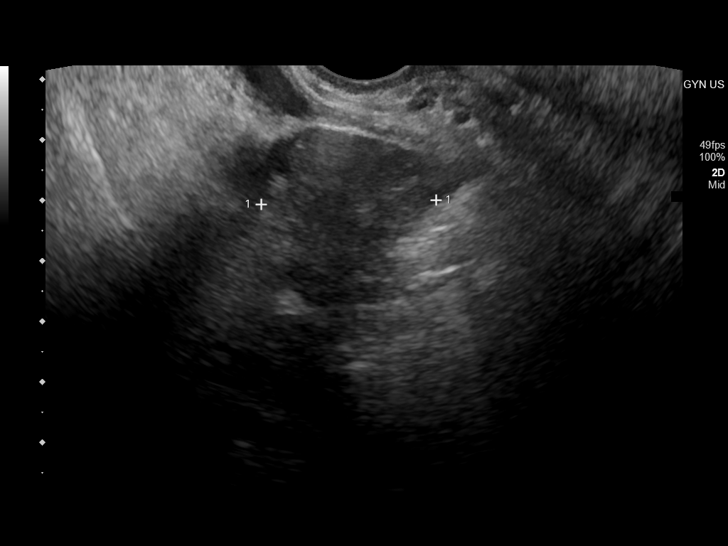
[im 113/113]
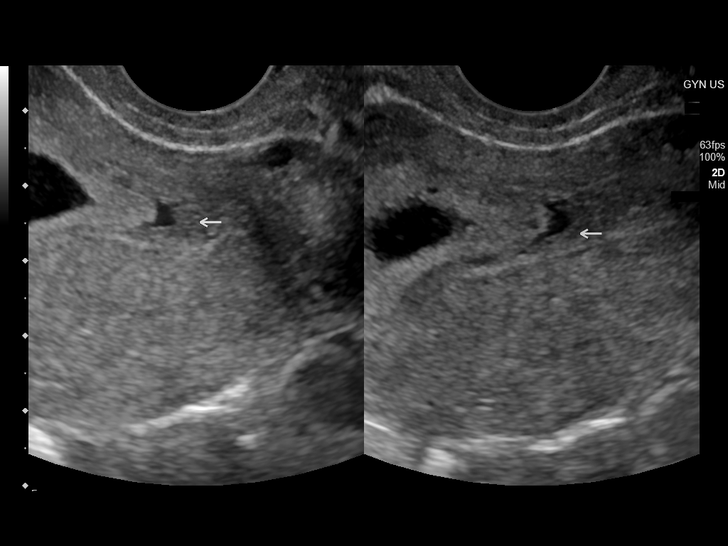

[14 of 28 positions shown; findings below may reference images not displayed]

FINDINGS: Intrauterine gestational sac: Single

Yolk sac:  Visualized.

Embryo:  Visualized.

Cardiac Activity: Visualized.

Heart Rate: 148 bpm

CRL:  10 mm   7 w   0 d                  US EDC: [DATE]

Subchorionic hemorrhage: Present to a small degree along the left
aspect of the sac, measuring up to 5 mm.

Maternal uterus/adnexae: Normal appearance of the ovaries. No
abnormal free fluid
IMPRESSION: 1. Single living intrauterine pregnancy measuring 7 weeks.
2. 5 mm subchorionic hemorrhage.

## 2021-05-10 NOTE — ED Provider Notes (Signed)
? ?[facility] ?Provider Note ? ? ? Event Date/Time  ? First MD Initiated Contact with Patient 05/10/21 1103   ?  (approximate) ? ? ?History  ? ?Possible Pregnancy and Vaginal Bleeding ? ? ?HPI ? ?Jayline Kilburg is a 31 y.o. female approximately [redacted] weeks pregnant who presents with vaginal bleeding.  Patient is currently undergoing IVF and is about [redacted] weeks pregnant.  This is her first pregnancy.  Last Monday about 6 days ago she had her first ultrasound and it was a fetal heartbeat.  2 days ago she felt a typical burning sensation that she feels when her period is coming on.  Then this morning when she woke up there was brown blood when she wiped.  She is not sure if she is having ongoing bleeding started about 45 minutes ago.  Patient denies abdominal pain or abdominal cramping.  No fevers chills nausea vomiting or urinary symptoms.  No abnormal discharge. ?  ? ?History reviewed. No pertinent past medical history. ? ?There are no problems to display for this patient. ? ? ? ?Physical Exam  ?Triage Vital Signs: ?ED Triage Vitals  ?Enc Vitals Group  ?   BP 05/10/21 1102 (!) 130/94  ?   Pulse Rate 05/10/21 1102 69  ?   Resp 05/10/21 1102 18  ?   Temp 05/10/21 1102 97.9 ?F (36.6 ?C)  ?   Temp Source 05/10/21 1102 Oral  ?   SpO2 05/10/21 1102 100 %  ?   Weight 05/10/21 1058 141 lb 1.5 oz (64 kg)  ?   Height 05/10/21 1058 5\' 10"  (1.778 m)  ?   Head Circumference --   ?   Peak Flow --   ?   Pain Score 05/10/21 1058 0  ?   Pain Loc --   ?   Pain Edu? --   ?   Excl. in GC? --   ? ? ?Most recent vital signs: ?Vitals:  ? 05/10/21 1104 05/10/21 1200  ?BP:  (!) 130/94  ?Pulse: 72 70  ?Resp: 16 16  ?Temp:    ?SpO2: 100% 100%  ? ? ? ?General: Awake, no distress.  ?CV:  Good peripheral perfusion.  ?Resp:  Normal effort.  ?Abd:  No distention.  Abdomen is soft and nontender throughout ?Neuro:             Awake, Alert, Oriented x 3  ?Other:   ? ? ?ED Results / Procedures / Treatments  ?Labs ?(all labs  ordered are listed, but only abnormal results are displayed) ?Labs Reviewed  ?HCG, QUANTITATIVE, PREGNANCY - Abnormal; Notable for the following components:  ?    Result Value  ? hCG, Beta Chain, Quant, S 102,139 (*)   ? All other components within normal limits  ?URINALYSIS, COMPLETE (UACMP) WITH MICROSCOPIC - Abnormal; Notable for the following components:  ? Color, Urine YELLOW (*)   ? APPearance HAZY (*)   ? Hgb urine dipstick SMALL (*)   ? Ketones, ur 20 (*)   ? Bacteria, UA RARE (*)   ? All other components within normal limits  ?POC URINE PREG, ED - Abnormal; Notable for the following components:  ? Preg Test, Ur POSITIVE (*)   ? All other components within normal limits  ?CBC WITH DIFFERENTIAL/PLATELET  ?COMPREHENSIVE METABOLIC PANEL  ?ABO/RH  ?SAMPLE TO BLOOD BANK  ? ? ? ?EKG ? ? ? ? ?RADIOLOGY ?I reviewed the transvaginal ultrasound which shows an IUP with a normal heart rate,  subchorionic hemorrhage ? ? ?PROCEDURES: ? ?Critical Care performed: No ? ?Procedures ? ?The patient is on the cardiac monitor to evaluate for evidence of arrhythmia and/or significant heart rate changes. ? ? ?MEDICATIONS ORDERED IN ED: ?Medications - No data to display ? ? ?IMPRESSION / MDM / ASSESSMENT AND PLAN / ED COURSE  ?I reviewed the triage vital signs and the nursing notes. ?             ?               ? ?Differential diagnosis includes, but is not limited to, threatened miscarriage, incomplete miscarriage, ectopic/heterotopic pregnancy, implantation bleeding, cervicitis ? ?Patient is a 31 year old female currently under going IVF in about [redacted] weeks pregnant who presents with 45 minutes of vaginal bleeding.  She has no associated pain or cramping or infectious symptoms.  She had a transvaginal ultrasound about 6 days ago and apparently there was a confirmed IUP with a heartbeat.  Patient denies any heavy bleeding at the moment but has not gone to the bathroom is not sure if she is having ongoing bleeding.  She is  understandably concerned about miscarriage.  She appears comfortable on exam vital signs within normal limits.  Will get a transvaginal ultrasound and check quant hCG to evaluate for miscarriage or heterotopic pregnancy. ? ?Patient's labs are largely reassuring, no anemia or leukocytosis, electrolytes normal on CMP.  UA without evidence of infection.  Transvaginal ultrasound demonstrates an IUP with normal fetal heart rate and a small subchorionic hemorrhage likely responsible for her bleeding.  Discussed this with the patient as well as return precautions and OB/GYN follow-up she is appropriate for discharge. ?Clinical Course as of 05/10/21 1257  ?Wynelle Link May 10, 2021  ?1142 Preg Test, Ur(!): POSITIVE [KM]  ?1229 HCG, Beta Chain, Quant, S(!): 102,139 [KM]  ?  ?Clinical Course User Index ?[KM] Georga Hacking, MD  ? ? ? ?FINAL CLINICAL IMPRESSION(S) / ED DIAGNOSES  ? ?Final diagnoses:  ?Subchorionic hematoma in first trimester, single or unspecified fetus  ? ? ? ?Rx / DC Orders  ? ?ED Discharge Orders   ? ? None  ? ?  ? ? ? ?Note:  This document was prepared using Dragon voice recognition software and may include unintentional dictation errors. ?  ?Georga Hacking, MD ?05/10/21 1257 ? ?

## 2021-05-10 NOTE — ED Triage Notes (Signed)
Pt reports is [redacted] weeks pregnant and undergoing IVF. Pt reports 2 days ago started with some abd discomfort and today started bleeding. Pt reports they were able to hear the baby's heartbeat on Monday. ?

## 2021-05-27 DIAGNOSIS — O09819 Supervision of pregnancy resulting from assisted reproductive technology, unspecified trimester: Secondary | ICD-10-CM

## 2021-07-22 ENCOUNTER — Other Ambulatory Visit: Payer: Self-pay | Admitting: Certified Nurse Midwife

## 2021-07-22 DIAGNOSIS — Z3689 Encounter for other specified antenatal screening: Secondary | ICD-10-CM

## 2021-07-23 ENCOUNTER — Telehealth: Payer: Self-pay | Admitting: Obstetrics and Gynecology

## 2021-07-23 NOTE — Telephone Encounter (Signed)
PC to Ms. Swift to schedule an ultrasound and genetic counseling due to an elevated msAFP.  She was referred from her OB at [facility].  Our clinic at [facility] has the next available appointment on 6/15, so I called to offer the option of our [facility] location because they are able to get her into an appointment earlier.  She prefers the earlier visit so our clinic will call her to schedule there.  If there is a problem, we can be reached at [phone] or in [place] at [phone].  Cherly Anderson, MS, CGC

## 2021-07-24 ENCOUNTER — Other Ambulatory Visit: Payer: Self-pay | Admitting: Certified Nurse Midwife

## 2021-07-24 DIAGNOSIS — Z3A20 20 weeks gestation of pregnancy: Secondary | ICD-10-CM

## 2021-07-24 DIAGNOSIS — Z3689 Encounter for other specified antenatal screening: Secondary | ICD-10-CM

## 2021-07-24 DIAGNOSIS — O280 Abnormal hematological finding on antenatal screening of mother: Secondary | ICD-10-CM

## 2021-08-11 ENCOUNTER — Other Ambulatory Visit: Payer: Federal, State, Local not specified - PPO

## 2021-08-11 ENCOUNTER — Ambulatory Visit: Payer: Federal, State, Local not specified - PPO

## 2021-10-05 DIAGNOSIS — O4442 Low lying placenta NOS or without hemorrhage, second trimester: Secondary | ICD-10-CM | POA: Insufficient documentation

## 2021-10-06 ENCOUNTER — Inpatient Hospital Stay (HOSPITAL_COMMUNITY)
Admission: AD | Admit: 2021-10-06 | Payer: Federal, State, Local not specified - PPO | Source: Home / Self Care | Admitting: Obstetrics & Gynecology

## 2021-12-15 ENCOUNTER — Other Ambulatory Visit: Payer: Self-pay

## 2021-12-15 ENCOUNTER — Encounter: Payer: Self-pay | Admitting: Obstetrics and Gynecology

## 2021-12-15 ENCOUNTER — Inpatient Hospital Stay
Admission: EM | Admit: 2021-12-15 | Discharge: 2021-12-18 | DRG: 806 | Disposition: A | Discharge status: Home / Self Care | Payer: Federal, State, Local not specified - PPO | Attending: Certified Nurse Midwife | Admitting: Certified Nurse Midwife

## 2021-12-15 DIAGNOSIS — Z3A38 38 weeks gestation of pregnancy: Secondary | ICD-10-CM

## 2021-12-15 DIAGNOSIS — O134 Gestational [pregnancy-induced] hypertension without significant proteinuria, complicating childbirth: Secondary | ICD-10-CM | POA: Diagnosis present

## 2021-12-15 DIAGNOSIS — O9081 Anemia of the puerperium: Secondary | ICD-10-CM | POA: Diagnosis not present

## 2021-12-15 DIAGNOSIS — R030 Elevated blood-pressure reading, without diagnosis of hypertension: Secondary | ICD-10-CM | POA: Diagnosis present

## 2021-12-15 DIAGNOSIS — O09819 Supervision of pregnancy resulting from assisted reproductive technology, unspecified trimester: Secondary | ICD-10-CM

## 2021-12-15 DIAGNOSIS — O322XX0 Maternal care for transverse and oblique lie, not applicable or unspecified: Secondary | ICD-10-CM | POA: Diagnosis present

## 2021-12-15 DIAGNOSIS — D62 Acute posthemorrhagic anemia: Secondary | ICD-10-CM | POA: Diagnosis not present

## 2021-12-15 DIAGNOSIS — O139 Gestational [pregnancy-induced] hypertension without significant proteinuria, unspecified trimester: Principal | ICD-10-CM | POA: Diagnosis present

## 2021-12-15 HISTORY — DX: Herpesviral infection of other urogenital tract: A60.09

## 2021-12-15 LAB — CBC
HCT: 35.5 % — ABNORMAL LOW (ref 36.0–46.0)
Hemoglobin: 12.1 g/dL (ref 12.0–15.0)
MCH: 28.9 pg (ref 26.0–34.0)
MCHC: 34.1 g/dL (ref 30.0–36.0)
MCV: 84.9 fL (ref 80.0–100.0)
Platelets: 143 10*3/uL — ABNORMAL LOW (ref 150–400)
RBC: 4.18 MIL/uL (ref 3.87–5.11)
RDW: 13.4 % (ref 11.5–15.5)
WBC: 7.2 10*3/uL (ref 4.0–10.5)
nRBC: 0 % (ref 0.0–0.2)

## 2021-12-15 LAB — COMPREHENSIVE METABOLIC PANEL
ALT: 16 U/L (ref 0–44)
AST: 24 U/L (ref 15–41)
Albumin: 3 g/dL — ABNORMAL LOW (ref 3.5–5.0)
Alkaline Phosphatase: 138 U/L — ABNORMAL HIGH (ref 38–126)
Anion gap: 6 (ref 5–15)
BUN: 8 mg/dL (ref 6–20)
CO2: 22 mmol/L (ref 22–32)
Calcium: 8.5 mg/dL — ABNORMAL LOW (ref 8.9–10.3)
Chloride: 107 mmol/L (ref 98–111)
Creatinine, Ser: 0.69 mg/dL (ref 0.44–1.00)
GFR, Estimated: >60 mL/min (ref >60)
Glucose, Bld: 79 mg/dL (ref 70–99)
Potassium: 3.3 mmol/L — ABNORMAL LOW (ref 3.5–5.1)
Sodium: 135 mmol/L (ref 135–145)
Total Bilirubin: 0.9 mg/dL (ref 0.3–1.2)
Total Protein: 6.3 g/dL — ABNORMAL LOW (ref 6.5–8.1)

## 2021-12-15 LAB — TYPE AND SCREEN
ABO/RH(D): A POS
Antibody Screen: NEGATIVE

## 2021-12-15 LAB — PROTEIN / CREATININE RATIO, URINE
Creatinine, Urine: 34 mg/dL
Protein Creatinine Ratio: 0.21 mg/mg{Cre} — ABNORMAL HIGH (ref 0.00–0.15)
Total Protein, Urine: 7 mg/dL

## 2021-12-15 MED ORDER — SODIUM CHLORIDE 0.9% FLUSH: 3.0000 mL | INTRAVENOUS | Status: DC | PRN

## 2021-12-15 MED ORDER — LABETALOL HCL 5 MG/ML IV SOLN
20.0000 mg | INTRAVENOUS | Status: DC | PRN
  Administered 2021-12-16: 20 mg via INTRAVENOUS
  Filled 2021-12-15: qty 4

## 2021-12-15 MED ORDER — TERBUTALINE SULFATE 1 MG/ML IJ SOLN: 0.2500 mg | Freq: Once | INTRAMUSCULAR | Status: DC | PRN

## 2021-12-15 MED ORDER — CALCIUM CARBONATE ANTACID 500 MG PO CHEW: 2.0000 | CHEWABLE_TABLET | ORAL | Status: DC | PRN

## 2021-12-15 MED ORDER — SODIUM CHLORIDE 0.9% FLUSH: 3.0000 mL | Freq: Two times a day (BID) | INTRAVENOUS | Status: DC

## 2021-12-15 MED ORDER — ONDANSETRON HCL 4 MG/2ML IJ SOLN
4.0000 mg | Freq: Four times a day (QID) | INTRAMUSCULAR | Status: DC | PRN
  Administered 2021-12-16: 4 mg via INTRAVENOUS
  Filled 2021-12-15: qty 2

## 2021-12-15 MED ORDER — FENTANYL CITRATE (PF) 100 MCG/2ML IJ SOLN
50.0000 ug | INTRAMUSCULAR | Status: DC | PRN
  Administered 2021-12-16 (×4): 100 ug via INTRAVENOUS
  Filled 2021-12-15 (×5): qty 2

## 2021-12-15 MED ORDER — MISOPROSTOL 200 MCG PO TABS
ORAL_TABLET | ORAL | Status: AC
  Filled 2021-12-15: qty 4

## 2021-12-15 MED ORDER — LIDOCAINE HCL (PF) 1 % IJ SOLN: 30.0000 mL | INTRAMUSCULAR | Status: DC | PRN

## 2021-12-15 MED ORDER — SODIUM CHLORIDE 0.9 % IV SOLN
250.0000 mL | INTRAVENOUS | Status: DC | PRN
  Administered 2021-12-16: 250 mL via INTRAVENOUS

## 2021-12-15 MED ORDER — HYDRALAZINE HCL 20 MG/ML IJ SOLN: 10.0000 mg | INTRAMUSCULAR | Status: DC | PRN

## 2021-12-15 MED ORDER — OXYTOCIN-SODIUM CHLORIDE 30-0.9 UT/500ML-% IV SOLN
1.0000 m[IU]/min | INTRAVENOUS | Status: DC
  Administered 2021-12-16: 2 m[IU]/min via INTRAVENOUS

## 2021-12-15 MED ORDER — OXYTOCIN-SODIUM CHLORIDE 30-0.9 UT/500ML-% IV SOLN
2.5000 [IU]/h | INTRAVENOUS | Status: DC
  Administered 2021-12-16: 2.5 [IU]/h via INTRAVENOUS
  Filled 2021-12-15: qty 500

## 2021-12-15 MED ORDER — LIDOCAINE HCL (PF) 1 % IJ SOLN
INTRAMUSCULAR | Status: AC
  Filled 2021-12-15: qty 30

## 2021-12-15 MED ORDER — AMMONIA AROMATIC IN INHA
RESPIRATORY_TRACT | Status: AC
  Filled 2021-12-15: qty 10

## 2021-12-15 MED ORDER — LABETALOL HCL 5 MG/ML IV SOLN
80.0000 mg | INTRAVENOUS | Status: DC | PRN
  Administered 2021-12-16: 80 mg via INTRAVENOUS
  Filled 2021-12-15: qty 16

## 2021-12-15 MED ORDER — ACETAMINOPHEN 500 MG PO TABS: 1000.0000 mg | ORAL_TABLET | Freq: Four times a day (QID) | ORAL | Status: DC | PRN

## 2021-12-15 MED ORDER — MISOPROSTOL 25 MCG QUARTER TABLET
25.0000 ug | ORAL_TABLET | ORAL | Status: DC | PRN
  Administered 2021-12-15 – 2021-12-16 (×3): 25 ug via VAGINAL
  Filled 2021-12-15 (×3): qty 1

## 2021-12-15 MED ORDER — OXYTOCIN 10 UNIT/ML IJ SOLN
INTRAMUSCULAR | Status: AC
  Filled 2021-12-15: qty 2

## 2021-12-15 MED ORDER — LACTATED RINGERS IV SOLN
500.0000 mL | INTRAVENOUS | Status: DC | PRN
  Administered 2021-12-16: 500 mL via INTRAVENOUS

## 2021-12-15 MED ORDER — LACTATED RINGERS IV SOLN: INTRAVENOUS | Status: DC

## 2021-12-15 MED ORDER — SOD CITRATE-CITRIC ACID 500-334 MG/5ML PO SOLN: 30.0000 mL | ORAL | Status: DC | PRN

## 2021-12-15 MED ORDER — OXYTOCIN BOLUS FROM INFUSION
333.0000 mL | Freq: Once | INTRAVENOUS | Status: AC
  Administered 2021-12-16: 333 mL via INTRAVENOUS

## 2021-12-15 MED ORDER — LABETALOL HCL 5 MG/ML IV SOLN
40.0000 mg | INTRAVENOUS | Status: DC | PRN
  Administered 2021-12-16: 40 mg via INTRAVENOUS
  Filled 2021-12-15: qty 8

## 2021-12-15 NOTE — Progress Notes (Addendum)
L&D Note    Subjective:  Denies significant cramping, going to try to get some rest   Objective:   Vitals:   12/15/21 2008 12/15/21 2023 12/15/21 2038 12/15/21 2053  BP: (!) 149/99 (!) 143/109 (!) 147/100 (!) 156/98  Pulse: 66 84 72 83  Resp:      Temp:      TempSrc:      Weight:      Height:        Current Vital Signs 24h Vital Sign Ranges  T 98.5 F (36.9 C) Temp  Avg: 98.4 F (36.9 C)  Min: 98.3 F (36.8 C)  Max: 98.5 F (36.9 C)  BP (!) 156/98 BP  Min: 143/109  Max: 170/109  HR 83 Pulse  Avg: 70.2  Min: 60  Max: 84  RR 18 Resp  Avg: 17  Min: 16  Max: 18  SaO2     No data recorded      Gen: alert, cooperative, no distress FHR: Baseline: 155 bpm, Variability: moderate, Accelerations: preseent, and Decelerations: Absent Toco: irregular, every 2-7 minutes SVE: Dilation: 1 Effacement (%): 60 Cervical Position: Posterior Station: -1 Presentation: Vertex Exam by:: Paxon Propes CNM  Medications SCHEDULED MEDICATIONS   ammonia       lidocaine (PF)       misoprostol       oxytocin       oxytocin 40 units in LR 1000 mL  333 mL Intravenous Once   sodium chloride flush  3 mL Intravenous Q12H    MEDICATION INFUSIONS   sodium chloride     lactated ringers     lactated ringers 125 mL/hr at 12/15/21 1749   oxytocin     oxytocin      PRN MEDICATIONS  sodium chloride, acetaminophen, ammonia, calcium carbonate, fentaNYL (SUBLIMAZE) injection, labetalol **AND** labetalol **AND** labetalol **AND** hydrALAZINE **AND** Measure blood pressure, lactated ringers, lidocaine (PF), lidocaine (PF), misoprostol, misoprostol, ondansetron, oxytocin, sodium chloride flush, sodium citrate-citric acid, terbutaline   Assessment & Plan:  31 y.o. G1P0 at [redacted]w[redacted]d with gestational hypertension *Labor: cervical ripening with misoprostol *Fetal Well-being: Category I *GBS: negative *Intermittent severe range BP's but mild range on recheck  *Denies HA, visual changes, or RUQ pain   Minda Meo, CNM   12/15/2021 10:33 PM  [facility]

## 2021-12-15 NOTE — H&P (Signed)
OB History & Physical   History of Present Illness:   Chief Complaint: elevated blood pressure in office   HPI:  Starletta Houchin is a 31 y.o. G1P0 female at [redacted]w[redacted]d, Patient's last menstrual period was 03/21/2021 (approximate)., consistent with [place] at [redacted]w[redacted]d, with Estimated Date of Delivery: 12/26/21.  She presents to L&D for elevated blood pressure in the office.  She was seen for a routine prenatal visit.  She had a mild range BP x 2.  Her blood pressures continued to be elevated in L&D triage.  She reports a headache on and off over the past couple of weeks.  Also reports black spots 2 weeks ago but they resolved last week.  She denies significant HA, changes in vision, or RUQ pain today.  Denies Cramping or contractions, LOF, or vaginal bleeding.  Endorses good fetal movement.   Reports active fetal movement  Contractions: denies  LOF/SROM: denies  Vaginal bleeding: denies   Factors complicating pregnancy:  Gestational hypertension Pregnancy achieved with IVF Generalized anxiety disorder Rubella non-immune   Patient Active Problem List   Diagnosis Date Noted   Gestational hypertension 12/15/2021   Low-lying placenta in second trimester 10/05/2021   Pregnancy resulting from assisted reproductive technology 05/27/2021   Generalized anxiety disorder 05/15/2019    Prenatal Transfer Tool  Maternal Diabetes: No Genetic Screening: Abnormal:  Results: Elevated AFP Maternal Ultrasounds/Referrals: Normal Fetal Ultrasounds or other Referrals:  Referred to Materal Fetal Medicine - [place] f/u after elevated AFP was normal  Maternal Substance Abuse:  No Significant Maternal Medications:  None Significant Maternal Lab Results: Group B Strep negative  Maternal Medical History:   Past Medical History:  Diagnosis Date   Herpes genitalis in women     Past Surgical History:  Procedure Laterality Date   NO PAST SURGERIES      No Known Allergies  Prior to Admission medications    Medication Sig Start Date End Date Taking? Authorizing Provider  Prenatal Vit-Fe Fumarate-FA (PRENATAL MULTIVITAMIN) TABS tablet Take 1 tablet by mouth daily at 12 noon.   Yes [provider]  valACYclovir (VALTREX) 500 MG tablet Take 500 mg by mouth daily.   Yes [provider]  aspirin 81 MG chewable tablet Chew 81 mg by mouth daily. Patient not taking: Reported on 12/15/2021    [provider]  estradiol (ESTRACE) 2 MG tablet Take 2 mg by mouth daily. Patient not taking: Reported on 12/15/2021    [provider]  estradiol (VIVELLE-DOT) 0.1 MG/24HR patch Place 1 patch onto the skin 2 (two) times a week. Patient not taking: Reported on 12/15/2021    [provider]  progesterone 50 MG/ML injection Inject 10 mg into the muscle daily. Patient not taking: Reported on 12/15/2021    [provider]     Prenatal care site:  [facility]  OB History  Gravida Para Term Preterm AB Living  1 0 0 0 0 0  SAB IAB Ectopic Multiple Live Births  0 0 0 0 0    # Outcome Date GA Lbr Len/2nd Weight Sex Delivery Anes PTL Lv  1 Current              Social History: She  reports that she has never smoked. She has never used smokeless tobacco. She reports that she does not currently use alcohol.  Family History: Family history is unknown by patient.   Review of Systems: A full review of systems was performed and negative except as noted in  the HPI.     Physical Exam:  Vital Signs: BP (!) 149/92   Pulse 60   Temp 98.3 F (36.8 C) (Oral)   Resp 16   Ht 5\' 10"  (1.778 m)   Wt 75.8 kg   LMP 03/21/2021 (Approximate)   BMI 23.96 kg/m   General: no acute distress.  HEENT: normocephalic, atraumatic Heart: regular rate & rhythm Lungs: normal respiratory effort Abdomen: soft, gravid, non-tender;  EFW: 6 1/2 lbs  Pelvic:   External: Normal external female genitalia  Cervix: Dilation: 1 / Effacement (%): 50 / Station: -1     Extremities: non-tender, symmetric, No edema bilaterally.  DTRs: 2+/2+  Neurologic: Alert & oriented x 3.    No results found for this or any previous visit (from the past 24 hour(s)).  Pertinent Results:  Prenatal Labs: Blood type/Rh A POS Performed at [facility], [address] [place], [place]    Antibody screen Negative    Rubella Non-immune    Varicella Immune  RPR NR    HBsAg NR   Hep C NR   HIV NR    GC neg  Chlamydia neg  Genetic screening cfDNA negative, AFP elevated - neg [id number]  1 hour GTT 102  3 hour GTT Negative   GBS Neg     FHT:  FHR: 130 bpm, variability: moderate,  accelerations:  Present,  decelerations:  Absent Category/reactivity:  Category I UC:   1-4 min, mild to palpation   Cephalic by Leopolds and SVE   No results found.  Assessment:  Yarisa Lynam is a 31 y.o. G1P0 female at [redacted]w[redacted]d with gestational hypertension.   Plan:  1. Admit to Labor & Delivery - consents reviewed and obtained - Dr. [redacted]w[redacted]d notified of admission and plan of care   2. Fetal Well being  - Fetal Tracing: cat 1 - Group B Streptococcus ppx not indicated: GBS negative - Presentation: cephalic confirmed by SVE   3. Routine OB: - Prenatal labs reviewed, as above - Rh positive - CBC, T&S, RPR on admit - Regular diet, saline lock  4. Induction of labor  - Contractions monitored with external toco - Pelvis adequate for trial of labor  - Plan for induction with misoprostol  - Induction with oxytocin, AROM, and cervical balloon as appropriate  - Plan for  continuous fetal monitoring - Maternal pain control as desired - Anticipate vaginal delivery  5. Post Partum Planning: - Infant feeding: breast feeding - Contraception:  TBD - required IVF for pregnancy - Tdap vaccine:  declined  - Flu vaccine:  declined   Dalbert Garnet, CNM 12/15/21 4:58 PM  12/17/21, CNM Certified Nurse Midwife [facility]

## 2021-12-16 ENCOUNTER — Encounter: Payer: Self-pay | Admitting: Obstetrics and Gynecology

## 2021-12-16 ENCOUNTER — Inpatient Hospital Stay: Payer: Federal, State, Local not specified - PPO | Admitting: Certified Registered"

## 2021-12-16 DIAGNOSIS — Z3A38 38 weeks gestation of pregnancy: Secondary | ICD-10-CM

## 2021-12-16 LAB — RPR: RPR Ser Ql: NONREACTIVE

## 2021-12-16 MED ORDER — SIMETHICONE 80 MG PO CHEW
80.0000 mg | CHEWABLE_TABLET | ORAL | Status: DC | PRN
  Filled 2021-12-16: qty 1

## 2021-12-16 MED ORDER — COCONUT OIL OIL: 1.0000 | TOPICAL_OIL | Status: DC | PRN

## 2021-12-16 MED ORDER — LIDOCAINE HCL (PF) 2 % IJ SOLN
INTRAMUSCULAR | Status: DC | PRN
  Administered 2021-12-16 (×2): 50 mg via EPIDURAL

## 2021-12-16 MED ORDER — EPHEDRINE 5 MG/ML INJ
10.0000 mg | INTRAVENOUS | Status: DC | PRN
  Filled 2021-12-16: qty 5

## 2021-12-16 MED ORDER — WITCH HAZEL-GLYCERIN EX PADS
1.0000 | MEDICATED_PAD | CUTANEOUS | Status: DC | PRN
  Administered 2021-12-16 – 2021-12-17 (×2): 1 via TOPICAL
  Filled 2021-12-16 (×3): qty 100

## 2021-12-16 MED ORDER — HYDROCODONE-ACETAMINOPHEN 5-325 MG PO TABS: 1.0000 | ORAL_TABLET | Freq: Three times a day (TID) | ORAL | Status: DC | PRN

## 2021-12-16 MED ORDER — BUPIVACAINE HCL (PF) 0.25 % IJ SOLN
INTRAMUSCULAR | Status: DC | PRN
  Administered 2021-12-16: 2 mL via EPIDURAL
  Administered 2021-12-16: 3 mL via EPIDURAL

## 2021-12-16 MED ORDER — DIPHENHYDRAMINE HCL 25 MG PO CAPS: 25.0000 mg | ORAL_CAPSULE | Freq: Four times a day (QID) | ORAL | Status: DC | PRN

## 2021-12-16 MED ORDER — LIDOCAINE-EPINEPHRINE (PF) 1.5 %-1:200000 IJ SOLN
INTRAMUSCULAR | Status: DC | PRN
  Administered 2021-12-16 (×2): 3 mL via PERINEURAL

## 2021-12-16 MED ORDER — FERROUS SULFATE 325 (65 FE) MG PO TABS
325.0000 mg | ORAL_TABLET | Freq: Two times a day (BID) | ORAL | Status: DC
  Administered 2021-12-17 – 2021-12-18 (×3): 325 mg via ORAL
  Filled 2021-12-16 (×3): qty 1

## 2021-12-16 MED ORDER — DIBUCAINE (PERIANAL) 1 % EX OINT
1.0000 | TOPICAL_OINTMENT | CUTANEOUS | Status: DC | PRN
  Administered 2021-12-16: 1 via RECTAL
  Filled 2021-12-16 (×5): qty 28

## 2021-12-16 MED ORDER — DIPHENHYDRAMINE HCL 50 MG/ML IJ SOLN: 12.5000 mg | INTRAMUSCULAR | Status: DC | PRN

## 2021-12-16 MED ORDER — LIDOCAINE HCL (PF) 1 % IJ SOLN
INTRAMUSCULAR | Status: DC | PRN
  Administered 2021-12-16 (×2): 3 mL

## 2021-12-16 MED ORDER — LACTATED RINGERS IV SOLN
500.0000 mL | Freq: Once | INTRAVENOUS | Status: AC
  Administered 2021-12-16: 250 mL via INTRAVENOUS

## 2021-12-16 MED ORDER — BENZOCAINE-MENTHOL 20-0.5 % EX AERO
1.0000 | INHALATION_SPRAY | CUTANEOUS | Status: DC | PRN
  Administered 2021-12-16 – 2021-12-17 (×2): 1 via TOPICAL
  Filled 2021-12-16 (×4): qty 56

## 2021-12-16 MED ORDER — FENTANYL-BUPIVACAINE-NACL 0.5-0.125-0.9 MG/250ML-% EP SOLN
EPIDURAL | Status: DC | PRN
  Administered 2021-12-16: 12 mL/h via EPIDURAL

## 2021-12-16 MED ORDER — ACETAMINOPHEN 325 MG PO TABS
650.0000 mg | ORAL_TABLET | ORAL | Status: DC | PRN
  Administered 2021-12-17 – 2021-12-18 (×3): 650 mg via ORAL
  Filled 2021-12-16 (×3): qty 2

## 2021-12-16 MED ORDER — PHENYLEPHRINE 80 MCG/ML (10ML) SYRINGE FOR IV PUSH (FOR BLOOD PRESSURE SUPPORT): 80.0000 ug | PREFILLED_SYRINGE | INTRAVENOUS | Status: DC | PRN

## 2021-12-16 MED ORDER — FENTANYL-BUPIVACAINE-NACL 0.5-0.125-0.9 MG/250ML-% EP SOLN
EPIDURAL | Status: AC
  Filled 2021-12-16: qty 250

## 2021-12-16 MED ORDER — ONDANSETRON HCL 4 MG/2ML IJ SOLN: 4.0000 mg | INTRAMUSCULAR | Status: DC | PRN

## 2021-12-16 MED ORDER — SENNOSIDES-DOCUSATE SODIUM 8.6-50 MG PO TABS
2.0000 | ORAL_TABLET | ORAL | Status: DC
  Administered 2021-12-17 (×2): 2 via ORAL
  Filled 2021-12-16 (×2): qty 2

## 2021-12-16 MED ORDER — EPHEDRINE 5 MG/ML INJ
10.0000 mg | INTRAVENOUS | Status: DC | PRN
  Administered 2021-12-16: 5 mg via INTRAVENOUS

## 2021-12-16 MED ORDER — ONDANSETRON HCL 4 MG PO TABS: 4.0000 mg | ORAL_TABLET | ORAL | Status: DC | PRN

## 2021-12-16 MED ORDER — FENTANYL-BUPIVACAINE-NACL 0.5-0.125-0.9 MG/250ML-% EP SOLN: 12.0000 mL/h | EPIDURAL | Status: DC | PRN

## 2021-12-16 MED ORDER — IBUPROFEN 600 MG PO TABS
600.0000 mg | ORAL_TABLET | Freq: Four times a day (QID) | ORAL | Status: DC
  Administered 2021-12-17 – 2021-12-18 (×7): 600 mg via ORAL
  Filled 2021-12-16 (×7): qty 1

## 2021-12-16 MED ORDER — PRENATAL MULTIVITAMIN CH
1.0000 | ORAL_TABLET | Freq: Every day | ORAL | Status: DC
  Administered 2021-12-17 – 2021-12-18 (×2): 1 via ORAL
  Filled 2021-12-16 (×2): qty 1

## 2021-12-16 NOTE — Progress Notes (Signed)
Labor Progress Note  Michele Mayo is a 31 y.o. G1P0 at [redacted]w[redacted]d by LMP admitted for induction of labor due to Hypertension.  Subjective: Pt is reporting intense rectal pressure  Objective: BP (!) 175/112   Pulse 93   Temp 98.1 F (36.7 C) (Oral)   Resp 16   Ht 5\' 10"  (1.778 m)   Wt 75.8 kg   LMP 03/21/2021 (Approximate)   SpO2 97%   BMI 23.98 kg/m  Vitals:   12/16/21 1116 12/16/21 1118 12/16/21 1122 12/16/21 1123  BP: (!) 142/97 (!) 146/92 (!) 155/95 (!) 155/95   12/16/21 1128 12/16/21 1133 12/16/21 1138 12/16/21 1153  BP: (!) 139/100 (!) 157/101 (!) 153/94 (!) 146/89   12/16/21 1208 12/16/21 1223 12/16/21 1238 12/16/21 1312  BP: (!) 131/117 (!) 142/101 (!) 172/101 (!) 175/112     Fetal Assessment: FHT:  FHR: 130 bpm, variability: moderate,  accelerations:  Present,  decelerations:  Present occasional earlies and variables Category/reactivity:  Category I UC:   regular, every 2-3 minutes SVE:    Dilation: 9cm  Effacement: --  Station:  0  Consistency: soft  Position: --  Membrane status: SROM'd at 0950 Amniotic color: Clear, bloody show   Assessment / Plan: Induction of labor due to gestational hypertension,  progressing well on pitocin  Labor: Progressing normally Preeclampsia:   Blood pressure range: 131-175 / 89-117  Labetalol 20mg  IV given 12/15/21 1709 PCR 210; AST/ALT/Cr WNL; Plt 143  Fetal Wellbeing:  Category I Pain Control:  Epidural I/D:   Afebrile, GBS neg, SROM x 3 hrs Anticipated MOD:  NSVD  Alpha, CNM 12/16/2021, 1:13 PM

## 2021-12-16 NOTE — Discharge Summary (Signed)
Postpartum Discharge Summary    Patient Name: Michele Mayo DOB: 1990-09-20 MRN: [id number]  Date of admission: 12/15/2021 Delivery date:12/16/2021  Delivering provider: Prentice Docker D  Date of discharge: 12/18/2021  Admitting diagnosis: Gestational hypertension [O13.9] Intrauterine pregnancy: [redacted]w[redacted]d    Secondary diagnosis:  Principal Problem:   Gestational hypertension Active Problems:   Pregnancy resulting from assisted reproductive technology   [redacted] weeks gestation of pregnancy  Additional problems: GHTN    Discharge diagnosis: Term Pregnancy Delivered                                              Post partum procedures: Venofer given on PPD1 Augmentation: Pitocin and Cytotec Complications: None  Hospital course: Induction of Labor With Vaginal Delivery   31y.o. yo G1P0 at 332w4das admitted to the hospital 12/15/2021 for induction of labor.  Indication for induction: Gestational hypertension.  Patient had an labor course complicated by prolonged 2nd stage.  Membrane Rupture Time/Date: 9:50 AM ,12/16/2021   Delivery Method:Vaginal, Vacuum (Extractor)  Episiotomy: None  Lacerations:  Perineal;2nd degree;Vaginal  Details of delivery can be found in separate delivery note.  Patient had a postpartum course [facility] on Procardia 3061mL daily on PPD1. Patient is discharged home 12/18/21.  Newborn Data: Birth date:12/16/2021  Birth time:6:40 PM  Gender:Female  Living status:Living  Apgars:7 ,9  Weight:3650 g   Magnesium Sulfate received: No BMZ received: No Rhophylac:N/A MMR: offered and declined T-DaP: declined Flu:  declined Transfusion:No  Physical exam  Vitals:   12/18/21 0425 12/18/21 0832 12/18/21 1112 12/18/21 1524  BP: 134/86 136/85 132/81 137/79  Pulse: 77 96 77 78  Resp: '16 20 20 18  ' Temp: 98 F (36.7 C) 98.4 F (36.9 C) 97.8 F (36.6 C) 98.6 F (37 C)  TempSrc: Oral Oral Oral Oral  SpO2: 99% 99%    Weight:       Height:       General: alert, cooperative, and no distress Lochia: appropriate Uterine Fundus: firm Incision: N/A DVT Evaluation: No evidence of DVT seen on physical exam. Labs: Lab Results  Component Value Date   WBC 12.7 (H) 12/17/2021   HGB 8.3 (L) 12/17/2021   HCT 24.9 (L) 12/17/2021   MCV 87.1 12/17/2021   PLT 125 (L) 12/17/2021      Latest Ref Rng & Units 12/15/2021    4:15 PM  CMP  Glucose 70 - 99 mg/dL 79   BUN 6 - 20 mg/dL 8   Creatinine 0.44 - 1.00 mg/dL 0.69   Sodium 135 - 145 mmol/L 135   Potassium 3.5 - 5.1 mmol/L 3.3   Chloride 98 - 111 mmol/L 107   CO2 22 - 32 mmol/L 22   Calcium 8.9 - 10.3 mg/dL 8.5   Total Protein 6.5 - 8.1 g/dL 6.3   Total Bilirubin 0.3 - 1.2 mg/dL 0.9   Alkaline Phos 38 - 126 U/L 138   AST 15 - 41 U/L 24   ALT 0 - 44 U/L 16    Edinburgh Score:    12/18/2021    5:43 AM  Edinburgh Postnatal Depression Scale Screening Tool  I have been able to laugh and see the funny side of things. 0  I have looked forward with enjoyment to things. 0  I have blamed myself unnecessarily when things went wrong. 1  I  have been anxious or worried for no good reason. 0  I have felt scared or panicky for no good reason. 1  Things have been getting on top of me. 0  I have been so unhappy that I have had difficulty sleeping. 0  I have felt sad or miserable. 0  I have been so unhappy that I have been crying. 0  The thought of harming myself has occurred to me. 0  Edinburgh Postnatal Depression Scale Total 2      After visit meds:  Allergies as of 12/18/2021   No Known Allergies      Medication List     STOP taking these medications    aspirin 81 MG chewable tablet   estradiol 0.1 MG/24HR patch Commonly known as: VIVELLE-DOT   estradiol 2 MG tablet Commonly known as: ESTRACE   progesterone 50 MG/ML injection       TAKE these medications    acetaminophen 325 MG tablet Commonly known as: Tylenol Take 2 tablets (650 mg total) by  mouth every 4 (four) hours as needed (for pain scale < 4).   benzocaine-Menthol 20-0.5 % Aero Commonly known as: DERMOPLAST Apply 1 Application topically as needed for irritation (perineal discomfort).   coconut oil Oil Apply 1 Application topically as needed.   diphenhydrAMINE 25 mg capsule Commonly known as: BENADRYL Take 1 capsule (25 mg total) by mouth every 6 (six) hours as needed for itching.   ferrous sulfate 325 (65 FE) MG tablet Take 1 tablet (325 mg total) by mouth 2 (two) times daily with a meal.   ibuprofen 600 MG tablet Commonly known as: ADVIL Take 1 tablet (600 mg total) by mouth every 6 (six) hours.   NIFEdipine 30 MG 24 hr tablet Commonly known as: ADALAT CC Take 1 tablet (30 mg total) by mouth daily. Start taking on: December 19, 2021   prenatal multivitamin Tabs tablet Take 1 tablet by mouth daily at 12 noon.   senna-docusate 8.6-50 MG tablet Commonly known as: Senokot-S Take 2 tablets by mouth daily.   simethicone 80 MG chewable tablet Commonly known as: MYLICON Chew 1 tablet (80 mg total) by mouth as needed for flatulence.   valACYclovir 500 MG tablet Commonly known as: VALTREX Take 500 mg by mouth daily.   witch hazel-glycerin pad Commonly known as: TUCKS Apply 1 Application topically as needed for hemorrhoids.         Discharge home in stable condition Infant Feeding: Breast Infant Disposition:home with mother Discharge instruction: per After Visit Summary and Postpartum booklet. Activity: Advance as tolerated. Pelvic rest for 6 weeks.  Diet: routine diet Anticipated Birth Control:  declines contraception, IVF pregnancy/infertility.  Postpartum Appointment:2-3 days for BP check Additional Postpartum F/U: Postpartum Depression checkup Future Appointments:No future appointments. Follow up Visit:  [facility] Follow up on 12/21/2021.   Why: Follow up on Monday 12/21/2021. Contact information: [place] [phone]                   SIGNED: Francetta Found, CNM 12/18/2021 6:36 PM

## 2021-12-16 NOTE — Anesthesia Preprocedure Evaluation (Addendum)
Anesthesia Evaluation  Patient identified by MRN, date of birth, ID band Patient awake    History of Anesthesia Complications Negative for: history of anesthetic complications  Airway Mallampati: III       Dental   Pulmonary neg pulmonary ROS,           Cardiovascular hypertension,      Neuro/Psych Anxiety negative neurological ROS     GI/Hepatic Neg liver ROS, GERD  ,  Endo/Other  negative endocrine ROS  Renal/GU negative Renal ROS  negative genitourinary   Musculoskeletal negative musculoskeletal ROS (+)   Abdominal   Peds  Hematology negative hematology ROS (+)   Anesthesia Other Findings   Reproductive/Obstetrics (+) Pregnancy                            Anesthesia Physical Anesthesia Plan  ASA: 2  Anesthesia Plan: Epidural   Post-op Pain Management:    Induction:   PONV Risk Score and Plan: 2 and Treatment may vary due to age or medical condition  Airway Management Planned: Natural Airway  Additional Equipment:   Intra-op Plan:   Post-operative Plan:   Informed Consent: I have reviewed the patients History and Physical, chart, labs and discussed the procedure including the risks, benefits and alternatives for the proposed anesthesia with the patient or authorized representative who has indicated his/her understanding and acceptance.     Dental Advisory Given  Plan Discussed with:   Anesthesia Plan Comments: (Patient reports no bleeding problems and no anticoagulant use.   Patient consented for risks of anesthesia including but not limited to:  - adverse reactions to medications - risk of bleeding, infection and or nerve damage from epidural that could lead to paralysis - risk of headache or failed epidural - nerve damage due to positioning - that if epidural is used for C-section that there is a chance of epidural failure requiring spinal placement or conversion  to GA - Damage to heart, brain, lungs, other parts of body or loss of life  Patient voiced understanding.)        Anesthesia Quick Evaluation

## 2021-12-16 NOTE — Progress Notes (Signed)
Labor Progress Note  Michele Mayo is a 31 y.o. G1P0 at [redacted]w[redacted]d by LMP admitted for induction of labor due to Hypertension.  Subjective: Pt SROM'd with increase in UCs intensity.  Pt now has an epidural and reports feeling rectal pressure with each UC.  Objective: BP (!) 159/97   Pulse 88   Temp 98.1 F (36.7 C) (Oral)   Resp 16   Ht 5\' 10"  (1.778 m)   Wt 75.8 kg   LMP 03/21/2021 (Approximate)   SpO2 96%   BMI 23.98 kg/m  Vitals:   12/16/21 0822 12/16/21 0828 12/16/21 0837 12/16/21 0852  BP: (!) 144/114 (!) 146/98 (!) 148/109 (!) 151/106   12/16/21 0907 12/16/21 0922 12/16/21 0937 12/16/21 0953  BP: (!) 151/108 (!) 160/100 132/80 139/88   12/16/21 1025 12/16/21 1028 12/16/21 1040 12/16/21 1055  BP: (!) 170/87 (!) 152/85 (!) 163/115 (!) 159/97     Fetal Assessment: FHT:  FHR: 135 bpm, variability: moderate,  accelerations:  Present,  decelerations:  Absent Category/reactivity:  Category I UC:   regular, every 2-3 minutes SVE:    Dilation: 8cm  Effacement: 80%  Station:  0  Consistency: soft  Position: anterior  Membrane status: SROM'd at 0950 Amniotic color: Clear, bloody show   Assessment / Plan: Induction of labor due to gestational hypertension,  progressing well on pitocin  Labor: Progressing normally Preeclampsia:   Blood pressure range: 132-170  /  80-115  with no need for treatment 12/15/21 1709 PCR 210; AST/ALT/Cr WNL; Plt 143  Fetal Wellbeing:  Category I Pain Control:  Epidural I/D:   Afebrile, GBS neg, SROM x 2 hrs Anticipated MOD:  NSVD  Clydene Laming, CNM 12/16/2021, 12:04 PM

## 2021-12-16 NOTE — Anesthesia Procedure Notes (Signed)
Epidural Patient location during procedure: OB Start time: 12/16/2021 11:00 AM  Staffing Performed: resident/CRNA   Preanesthetic Checklist Completed: patient identified, IV checked, site marked, risks and benefits discussed, surgical consent, monitors and equipment checked, pre-op evaluation and timeout performed  Epidural Patient position: sitting Prep: ChloraPrep Patient monitoring: heart rate, continuous pulse ox and blood pressure Approach: midline Location: L3-L4 Injection technique: LOR saline  Needle:  Needle type: Tuohy  Needle gauge: 17 G Needle length: 9 cm Needle insertion depth: 6 cm Catheter type: closed end flexible Catheter size: 19 Gauge Catheter at skin depth: 11 cm Test dose: negative and 1.5% lidocaine with Epi 1:200 K  Assessment Events: blood not aspirated, injection not painful, no injection resistance, no paresthesia and negative IV test  Additional Notes Atraumatic attempt x1.  Pt tolerated well.

## 2021-12-16 NOTE — Progress Notes (Signed)
Labor Progress Note  Michele Mayo is a 31 y.o. G1P0 at [redacted]w[redacted]d by LMP admitted for induction of labor due to Hypertension.  Subjective: Reports mild cramping  Objective: BP (!) 159/97   Pulse 88   Temp 98.1 F (36.7 C) (Oral)   Resp 16   Ht 5\' 10"  (1.778 m)   Wt 75.8 kg   LMP 03/21/2021 (Approximate)   SpO2 96%   BMI 23.98 kg/m    Fetal Assessment: FHT:  FHR: 135 bpm, variability: moderate,  accelerations:  Present,  decelerations:  Absent Category/reactivity:  Category I UC:   regular, every 1-2 minutes SVE:    Dilation: 1cm  Effacement: 60%  Station:  -1  Consistency: --  Position: --  Membrane status:Intact Amniotic color: n/a   Assessment / Plan: Induction of labor due to gestational hypertension,  will start pitocin  Labor: Progressing normally Preeclampsia:   Blood pressure range: 140-163  /  91-114  with no need for treatment 12/15/21 1709 PCR 210; AST/ALT/Cr WNL; Plt 143   Fetal Wellbeing:  Category I Pain Control:  Epidural I/D:   Afebrile, GBS neg, Intact Anticipated MOD:  NSVD  [place], CNM 12/16/2021, 12:00 PM

## 2021-12-16 NOTE — Anesthesia Procedure Notes (Signed)
Epidural Patient location during procedure: OB Start time: 12/16/2021 1:50 PM End time: 12/16/2021 1:55 PM  Staffing Anesthesiologist: Darrin Nipper, MD Resident/CRNA: Hedda Slade, CRNA Performed: resident/CRNA   Preanesthetic Checklist Completed: patient identified, IV checked, site marked, risks and benefits discussed, surgical consent, monitors and equipment checked, pre-op evaluation and timeout performed  Epidural Patient position: sitting Prep: ChloraPrep Patient monitoring: heart rate, continuous pulse ox and blood pressure Approach: midline Location: L3-L4 Injection technique: LOR saline  Needle:  Needle type: Tuohy  Needle gauge: 17 G Needle length: 9 cm and 9 Needle insertion depth: 6.5 cm Catheter type: closed end flexible Catheter size: 19 Gauge Catheter at skin depth: 12 cm Test dose: negative and 1.5% lidocaine with Epi 1:200 K  Assessment Sensory level: T10 Events: blood not aspirated, injection not painful, no injection resistance, no paresthesia and negative IV test  Additional Notes 1 attempt Pt. Evaluated and documentation done after procedure finished. Patient identified. Risks/Benefits/Options discussed with patient including but not limited to bleeding, infection, nerve damage, paralysis, failed block, incomplete pain control, headache, blood pressure changes, nausea, vomiting, reactions to medication both or allergic, itching and postpartum back pain. Confirmed with bedside nurse the patient's most recent platelet count. Confirmed with patient that they are not currently taking any anticoagulation, have any bleeding history or any family history of bleeding disorders. Patient expressed understanding and wished to proceed. All questions were answered. Sterile technique was used throughout the entire procedure. Please see nursing notes for vital signs. Test dose was given through epidural catheter and negative prior to continuing to dose epidural or start  infusion. Warning signs of high block given to the patient including shortness of breath, tingling/numbness in hands, complete motor block, or any concerning symptoms with instructions to call for help. Patient was given instructions on fall risk and not to get out of bed. All questions and concerns addressed with instructions to call with any issues or inadequate analgesia.    Patient tolerated the insertion well without immediate complications.Reason for block:procedure for pain

## 2021-12-17 LAB — CBC
HCT: 24.9 % — ABNORMAL LOW (ref 36.0–46.0)
Hemoglobin: 8.3 g/dL — ABNORMAL LOW (ref 12.0–15.0)
MCH: 29 pg (ref 26.0–34.0)
MCHC: 33.3 g/dL (ref 30.0–36.0)
MCV: 87.1 fL (ref 80.0–100.0)
Platelets: 125 10*3/uL — ABNORMAL LOW (ref 150–400)
RBC: 2.86 MIL/uL — ABNORMAL LOW (ref 3.87–5.11)
RDW: 13.3 % (ref 11.5–15.5)
WBC: 12.7 10*3/uL — ABNORMAL HIGH (ref 4.0–10.5)
nRBC: 0 % (ref 0.0–0.2)

## 2021-12-17 MED ORDER — SODIUM CHLORIDE 0.9 % IV SOLN
300.0000 mg | INTRAVENOUS | Status: DC
  Administered 2021-12-17: 300 mg via INTRAVENOUS
  Filled 2021-12-17: qty 300

## 2021-12-17 MED ORDER — DOCUSATE SODIUM 100 MG PO CAPS
100.0000 mg | ORAL_CAPSULE | Freq: Every day | ORAL | Status: DC | PRN
  Administered 2021-12-17: 100 mg via ORAL
  Filled 2021-12-17 (×2): qty 1

## 2021-12-17 MED ORDER — NIFEDIPINE ER OSMOTIC RELEASE 30 MG PO TB24
30.0000 mg | ORAL_TABLET | Freq: Every day | ORAL | Status: DC
  Administered 2021-12-17 – 2021-12-18 (×2): 30 mg via ORAL
  Filled 2021-12-17 (×2): qty 1

## 2021-12-17 MED ORDER — SODIUM CHLORIDE 0.9 % IV SOLN: INTRAVENOUS | Status: DC | PRN

## 2021-12-17 NOTE — Progress Notes (Signed)
Post Partum Day 1 Subjective: Doing well, no complaints.  Tolerating regular diet, pain with PO meds, voiding and ambulating without difficulty.  No CP SOB Fever,Chills, N/V or leg pain; denies nipple or breast pain, no HA change of vision, RUQ/epigastric pain  Objective: BP 122/73 (BP Location: Right Arm)   Pulse 80   Temp 98.1 F (36.7 C)   Resp 20   Ht 5\' 10"  (1.778 m)   Wt 75.8 kg   LMP 03/21/2021 (Approximate)   SpO2 99%   Breastfeeding Unknown   BMI 23.98 kg/m    Physical Exam:  General: NAD Breasts: soft/nontender,  CV: RRR Pulm: nl effort, CTABL Abdomen: soft, NT, BS x 4 Perineum:  minimal edema, repair well approximated Lochia: moderate Uterine Fundus: fundus firm and 1 fb below umbilicus DVT Evaluation: no cords, ttp LEs   Recent Labs    12/15/21 1615 12/17/21 0534  HGB 12.1 8.3*  HCT 35.5* 24.9*  WBC 7.2 12.7*  PLT 143* 125*    Assessment/Plan: 31 y.o. G1P1001 postpartum day # 1  - Continue routine PP care - Lactation consult - Discussed contraceptive options including implant, IUDs hormonal and non-hormonal, injection, pills/ring/patch, condoms, and NFP.  - Acute blood loss anemia - hemodynamically stable and asymptomatic; start po ferrous sulfate BID with stool softeners  - Immunization status: declined TDAP and flu  Disposition: Does not desire Dc home today.   Gertie Fey, CNM 12/17/2021 8:54 AM

## 2021-12-17 NOTE — Anesthesia Postprocedure Evaluation (Signed)
Anesthesia Post Note  Patient: Keyri Salberg  Procedure(s) Performed: AN AD [place]  Patient location during evaluation: Mother Baby Anesthesia Type: Epidural Level of consciousness: awake, oriented and awake and alert Pain management: pain level controlled Vital Signs Assessment: post-procedure vital signs reviewed and stable Respiratory status: spontaneous breathing, respiratory function stable and nonlabored ventilation Cardiovascular status: blood pressure returned to baseline and stable Postop Assessment: no headache and no backache Anesthetic complications: no   No notable events documented.   Last Vitals:  Vitals:   12/16/21 2347 12/17/21 0255  BP: 122/76 122/73  Pulse: 78 80  Resp: 18 20  Temp: 36.9 C 36.7 C  SpO2: 100% 99%    Last Pain:  Vitals:   12/17/21 0538  TempSrc:   PainSc: 0-No pain                 Johnna Acosta

## 2021-12-18 ENCOUNTER — Other Ambulatory Visit: Payer: Self-pay

## 2021-12-18 MED ORDER — SIMETHICONE 80 MG PO CHEW
80.0000 mg | CHEWABLE_TABLET | ORAL | 0 refills | Status: AC | PRN
  Filled 2021-12-18: qty 30, fill #0

## 2021-12-18 MED ORDER — IBUPROFEN 600 MG PO TABS
600.0000 mg | ORAL_TABLET | Freq: Four times a day (QID) | ORAL | 0 refills | Status: AC
  Filled 2021-12-18: qty 30, 8d supply, fill #0

## 2021-12-18 MED ORDER — NIFEDIPINE ER 30 MG PO TB24
30.0000 mg | ORAL_TABLET | Freq: Every day | ORAL | 1 refills | Status: DC
  Filled 2021-12-18: qty 30, 30d supply, fill #0

## 2021-12-18 MED ORDER — ACETAMINOPHEN 325 MG PO TABS
650.0000 mg | ORAL_TABLET | ORAL | 0 refills | Status: AC | PRN
  Filled 2021-12-18: qty 30, 3d supply, fill #0

## 2021-12-18 MED ORDER — DIPHENHYDRAMINE HCL 25 MG PO CAPS: 25.0000 mg | ORAL_CAPSULE | Freq: Four times a day (QID) | ORAL | 0 refills | Status: DC | PRN

## 2021-12-18 MED ORDER — SENNOSIDES-DOCUSATE SODIUM 8.6-50 MG PO TABS
2.0000 | ORAL_TABLET | ORAL | 0 refills | Status: AC
  Filled 2021-12-18: qty 60, 30d supply, fill #0

## 2021-12-18 MED ORDER — WITCH HAZEL-GLYCERIN EX PADS: 1.0000 | MEDICATED_PAD | CUTANEOUS | 12 refills | Status: AC | PRN

## 2021-12-18 MED ORDER — FERROUS SULFATE 325 (65 FE) MG PO TABS
325.0000 mg | ORAL_TABLET | Freq: Two times a day (BID) | ORAL | 0 refills | Status: DC
  Filled 2021-12-18: qty 60, 30d supply, fill #0

## 2021-12-18 MED ORDER — COCONUT OIL OIL: 1.0000 | TOPICAL_OIL | 0 refills | Status: AC | PRN

## 2021-12-18 MED ORDER — BENZOCAINE-MENTHOL 20-0.5 % EX AERO: 1.0000 | INHALATION_SPRAY | CUTANEOUS | Status: AC | PRN

## 2021-12-18 NOTE — Progress Notes (Signed)
Patient discharged. Discharge instructions given. Patient verbalizes understanding. Transported by staff. 

## 2021-12-18 NOTE — Lactation Note (Signed)
This note was copied from a baby's chart. Lactation Consultation Note  Patient Name: Boy Madylin Fairbank Today's Date: 12/18/2021 Reason for consult: Initial assessment;Primapara;Early term 37-38.6wks Age:30 hours  Maternal Data Has patient been taught Hand Expression?: Yes Does the patient have breastfeeding experience prior to this delivery?: No  P1, IVF baby, has been breastfeeding with visible nipple damage. She worked with RN overnight for some improvement and healing.  Feeding Mother's Current Feeding Choice: Breast Milk  We discussed feeding position and support tools to improve baby's latch and mom's discomfort.  LC explained nipple shield, mom open to trying.  Educated on nipple shield placement, signs of transfer, and cleaning.  LATCH Score Latch: Grasps breast easily, tongue down, lips flanged, rhythmical sucking.  Audible Swallowing: Spontaneous and intermittent  Type of Nipple: Everted at rest and after stimulation  Comfort (Breast/Nipple): Soft / non-tender (used nipple shield and mom had no visible damage)  Hold (Positioning): Assistance needed to correctly position infant at breast and maintain latch.  LATCH Score: 9  Baby accepted the nipple shield easily without rejection. Baby was tired but was able to feed well for about 7 minutes. Audible swallows and remenants of colostrum visible in the nipple when baby came off the breast.  Lactation Tools Discussed/Used Tools: Nipple Shields Nipple shield size: 20  Interventions Interventions: Breast feeding basics reviewed;Assisted with latch;Hand express;Education;Ice (use of nipple shield)  Encouraged to continue BF on demand, reviewed cluster feeding and growth spurts, milk supply and demand and normal course of lactation.  Discharge Discharge Education: Engorgement and breast care;Warning signs for feeding baby;Outpatient recommendation Pump: Personal  Plans to use nipple shield for now, tracking  feeds and output, with possible transition away from shield in a few weeks to determine if babys latch has improved. Encouraged pumping post feedings to ensure adequate emptying and promote adequate milk supply. Mom verbalizes understanding.  Information given on area pediatric dentists for opinions on oral restrictions that could be causing the early nipple damage.  Guidance given for breast fullness and engorgement and nipple care.  Outpatient lactation number provided; encouraged to call with questions/concerns.  Consult Status Consult Status: Complete    Lavonia Drafts 12/18/2021, 10:07 AM

## 2021-12-18 NOTE — Progress Notes (Signed)
Post Partum Day 2 Subjective: Doing well, no complaints.  Tolerating regular diet, pain with PO meds, voiding and ambulating without difficulty.  No CP SOB Fever,Chills, N/V or leg pain; denies nipple or breast pain, no HA change of vision, RUQ/epigastric pain  Objective: BP 136/85 (BP Location: Right Arm)   Pulse 96   Temp 98.4 F (36.9 C) (Oral)   Resp 20   Ht 5\' 10"  (1.778 m)   Wt 75.8 kg   LMP 03/21/2021 (Approximate)   SpO2 99%   Breastfeeding Unknown   BMI 23.98 kg/m    Vitals:   12/16/21 2104 12/16/21 2134 12/16/21 2237 12/16/21 2347  BP: (!) 125/47 115/75 110/73 122/76   12/17/21 0255 12/17/21 1205 12/17/21 1700 12/17/21 1710  BP: 122/73 (!) 145/93 (!) 139/91 (!) 141/91   12/17/21 1940 12/18/21 0014 12/18/21 0425 12/18/21 0832  BP: (!) 147/96 (!) 140/66 134/86 136/85     Physical Exam:  General: NAD Breasts: soft/nontender,  CV: RRR Pulm: nl effort, CTABL Abdomen: soft, NT, BS x 4 Perineum:  minimal edema, repair well approximated Lochia: small Uterine Fundus: fundus firm and 1 fb below umbilicus DVT Evaluation: no cords, ttp LEs   Recent Labs    12/15/21 1615 12/17/21 0534  HGB 12.1 8.3*  HCT 35.5* 24.9*  WBC 7.2 12.7*  PLT 143* 125*     Assessment/Plan: 31 y.o. G1P1001 postpartum day # 2  - Continue routine PP care - Lactation consult prn - GHTN: on procardia 30mg  XL daily, started yesterday. No sx Pre-E - Acute blood loss anemia - hemodynamically stable and asymptomatic; start po ferrous sulfate BID with stool softeners; received Venofer yesterday - Immunization status: declined TDAP and flu  Disposition: Does  desire Dc home today.   Francetta Found, CNM 12/18/2021 10:08 AM

## 2021-12-23 ENCOUNTER — Ambulatory Visit: Payer: Self-pay

## 2021-12-23 NOTE — Lactation Note (Addendum)
This note was copied from a baby's chart. Lactation Consultation Note  Patient Name: Alexyia Guarino Today's Date: 12/23/2021  Reseaon for consult: lactation problem Age:30 days  Maternal Data  This is mom's 1st baby, delivered vaginal, vacuum extractor on 12/16/21. Mom with history of gestational hypertension, IVF, and generalized anxiety.Mom reports her blood pressure has been improving and is now "high normal." Mom is continuing on blood pressure medication for another 2 weeks and will have a follow-up appointment with her Provider. Mom reports she is still experiencing some pain from delivery and hemorrhoids however that is slowly improving. Mom reports nipple damage that occurred post delivery while in the hospital has resolved. Today , mom reports on baby's DOL 4 baby had only 2 voids and no stool in 24 hours. Per mom she decided to pump prior to putting baby to breast, offers baby to breastfeed after pumping,and then supplements baby with her pumped milk and formula as needed. Mom reports she pumps 1-1.25 ounces with her [employer]inoh electric pump.Per mom baby takes 1.5-2 ounces per feeding about every 2.5-3 hours. Baby's weight today is 3612 grams prior to feeding.(1% from birthweight)  Feeding Mom reports her goal is to breastfeed and when she returns to work she will pump and provide milk for dad to feed the baby the bottle. Mom would like to use less formula if possible.  Pre/post weight done with Medela baby way scale. Baby transferred a total of 50 ml's in 23 minutes. Mom post pumped and obtained 12 ml's additional milk. Baby content and satisfied post breastfeeding. Mom positioned and latched baby in cross cradle. Provided mom tips and strategies to maximize position and latch techniques. Mom and LC noted multiple audible swallows throughout the feeding. Recommended mom massage her breast during the feeding to encourage baby as well as providing tactile stimulation to keep baby  interested in feeding.  LATCH Score  Latch score of 9, (1) for some assistance with positioning to align baby.   Lactation Tools Discussed/Used  Electric breastpump, hands free pumping  Interventions  Breastfeeding basics reviewed,breast massage,support pillow,Education: feeding plan.  Discharge  Provided mom with feeding plan.  Mom will offer baby to breastfeed at least 8 times in 24 hours following baby's cues for satiety. Mom can offer supplemental pumped milk and/or formula if needed if baby will not settle despite breastfeeding.Mom will keep an I/O diary for baby's feeds and output. Mom will continue to post pump and slowly decrease the number of pump sessions each day over 3-5 days. Mom will pump if baby will not effectively feed or she would like dad to offer a relief bottle in place of breastfeeding. Mom has [place] number if she would like additional breastfeeding assistance.  Consult Status  PRN.   Jonna Mataeo Ingwersen 12/23/2021, 4:29 PM

## 2021-12-26 ENCOUNTER — Observation Stay
Admission: EM | Admit: 2021-12-26 | Discharge: 2021-12-27 | Disposition: A | Discharge status: Home / Self Care | Payer: Federal, State, Local not specified - PPO | Attending: Obstetrics and Gynecology | Admitting: Obstetrics and Gynecology

## 2021-12-26 ENCOUNTER — Inpatient Hospital Stay: Admit: 2021-12-26 | Payer: Self-pay

## 2021-12-26 ENCOUNTER — Observation Stay: Payer: Federal, State, Local not specified - PPO

## 2021-12-26 ENCOUNTER — Other Ambulatory Visit: Payer: Self-pay

## 2021-12-26 ENCOUNTER — Encounter: Payer: Self-pay | Admitting: Emergency Medicine

## 2021-12-26 DIAGNOSIS — O165 Unspecified maternal hypertension, complicating the puerperium: Secondary | ICD-10-CM | POA: Diagnosis present

## 2021-12-26 DIAGNOSIS — R519 Headache, unspecified: Secondary | ICD-10-CM | POA: Diagnosis not present

## 2021-12-26 DIAGNOSIS — O1415 Severe pre-eclampsia, complicating the puerperium: Secondary | ICD-10-CM | POA: Diagnosis present

## 2021-12-26 DIAGNOSIS — I160 Hypertensive urgency: Principal | ICD-10-CM

## 2021-12-26 LAB — COMPREHENSIVE METABOLIC PANEL
ALT: 29 U/L (ref 0–44)
AST: 24 U/L (ref 15–41)
Albumin: 3.5 g/dL (ref 3.5–5.0)
Alkaline Phosphatase: 85 U/L (ref 38–126)
Anion gap: 6 (ref 5–15)
BUN: 18 mg/dL (ref 6–20)
CO2: 27 mmol/L (ref 22–32)
Calcium: 9 mg/dL (ref 8.9–10.3)
Chloride: 104 mmol/L (ref 98–111)
Creatinine, Ser: 0.61 mg/dL (ref 0.44–1.00)
GFR, Estimated: >60 mL/min (ref >60)
Glucose, Bld: 100 mg/dL — ABNORMAL HIGH (ref 70–99)
Potassium: 4 mmol/L (ref 3.5–5.1)
Sodium: 137 mmol/L (ref 135–145)
Total Bilirubin: 0.6 mg/dL (ref 0.3–1.2)
Total Protein: 7 g/dL (ref 6.5–8.1)

## 2021-12-26 LAB — CBC WITH DIFFERENTIAL/PLATELET
Abs Immature Granulocytes: 0.16 10*3/uL — ABNORMAL HIGH (ref 0.00–0.07)
Basophils Absolute: 0 10*3/uL (ref 0.0–0.1)
Basophils Relative: 0 %
Eosinophils Absolute: 0.2 10*3/uL (ref 0.0–0.5)
Eosinophils Relative: 2 %
HCT: 35.2 % — ABNORMAL LOW (ref 36.0–46.0)
Hemoglobin: 11.3 g/dL — ABNORMAL LOW (ref 12.0–15.0)
Immature Granulocytes: 2 %
Lymphocytes Relative: 27 %
Lymphs Abs: 2.5 10*3/uL (ref 0.7–4.0)
MCH: 28.7 pg (ref 26.0–34.0)
MCHC: 32.1 g/dL (ref 30.0–36.0)
MCV: 89.3 fL (ref 80.0–100.0)
Monocytes Absolute: 0.5 10*3/uL (ref 0.1–1.0)
Monocytes Relative: 6 %
Neutro Abs: 5.9 10*3/uL (ref 1.7–7.7)
Neutrophils Relative %: 63 %
Platelets: 375 10*3/uL (ref 150–400)
RBC: 3.94 MIL/uL (ref 3.87–5.11)
RDW: 14.3 % (ref 11.5–15.5)
WBC: 9.3 10*3/uL (ref 4.0–10.5)
nRBC: 0 % (ref 0.0–0.2)

## 2021-12-26 LAB — PROTEIN / CREATININE RATIO, URINE
Creatinine, Urine: 27 mg/dL
Protein Creatinine Ratio: 0.63 mg/mg{Cre} — ABNORMAL HIGH (ref 0.00–0.15)
Total Protein, Urine: 17 mg/dL

## 2021-12-26 LAB — PROTIME-INR
INR: 1 (ref 0.8–1.2)
Prothrombin Time: 13.2 seconds (ref 11.4–15.2)

## 2021-12-26 LAB — APTT: aPTT: 25 seconds (ref 24–36)

## 2021-12-26 MED ORDER — LACTATED RINGERS IV SOLN: INTRAVENOUS | Status: DC

## 2021-12-26 MED ORDER — LABETALOL HCL 5 MG/ML IV SOLN: 80.0000 mg | INTRAVENOUS | Status: DC | PRN

## 2021-12-26 MED ORDER — HYDRALAZINE HCL 20 MG/ML IJ SOLN: 10.0000 mg | INTRAMUSCULAR | Status: DC | PRN

## 2021-12-26 MED ORDER — BUTALBITAL-APAP-CAFFEINE 50-325-40 MG PO TABS: 2.0000 | ORAL_TABLET | Freq: Once | ORAL | Status: DC

## 2021-12-26 MED ORDER — LABETALOL HCL 5 MG/ML IV SOLN: 40.0000 mg | INTRAVENOUS | Status: DC | PRN

## 2021-12-26 MED ORDER — IBUPROFEN 600 MG PO TABS
600.0000 mg | ORAL_TABLET | Freq: Once | ORAL | Status: AC
  Administered 2021-12-27: 600 mg via ORAL
  Filled 2021-12-26: qty 1

## 2021-12-26 MED ORDER — MAGNESIUM SULFATE 40 GM/1000ML IV SOLN
2.0000 g/h | INTRAVENOUS | Status: DC
  Administered 2021-12-27: 2 g/h via INTRAVENOUS
  Filled 2021-12-26 (×2): qty 1000

## 2021-12-26 MED ORDER — MAGNESIUM SULFATE BOLUS VIA INFUSION
4.0000 g | Freq: Once | INTRAVENOUS | Status: AC
  Administered 2021-12-26: 4 g via INTRAVENOUS
  Filled 2021-12-26: qty 1000

## 2021-12-26 MED ORDER — LABETALOL HCL 5 MG/ML IV SOLN: 20.0000 mg | INTRAVENOUS | Status: DC | PRN

## 2021-12-26 NOTE — ED Triage Notes (Deleted)
Pt presents to ER with cough and fever for 2 weeks. Pt reports fever at home highest 102 F. Pt reports chest pain and shortness of breath. HX POTS. Pt talks in complete sentences no respiratory distress noted

## 2021-12-26 NOTE — ED Notes (Signed)
ED provider at bedside.

## 2021-12-26 NOTE — ED Triage Notes (Signed)
Pt to ED via POV, pt states that she gave birth 1 week ago, pt was induced early due to blood pressure issues. Pt states that she has been having high blood pressure, headaches, and feeling faint for the last few days. Pt reports that 3 days she had RUQ abdominal pain but this has since resolved. Pt states that she spoke with her OB who wanted her to in and be evaluated for Eclampsia. Pt states that she is still currently taking blood pressure medication.

## 2021-12-26 NOTE — ED Notes (Addendum)
Pt to ED 10 days ago at around 38+[redacted] weeks pregnant for gestational hypertension, not preeclampsia. Pt presents with "raging headache the past 2 days" and dizziness with standing. HA is temporal, not occipital. Had blurry vision 3 days ago lasting about 3 hours. Had RUQ pain 3 days ago, all day. Today RUQ is nontender to palpation. Pt does not have blurry vision now.  Taking 30mg  nifedipine daily since hospital discharge. Fundus is firm, central, at 5FB below umbilicus. Bleeding is light, changing pads about every 5 hours.

## 2021-12-26 NOTE — ED Provider Notes (Signed)
[facility] Provider Note    Event Date/Time   First MD Initiated Contact with Patient 12/26/21 1802     (approximate)   History   Post Delivery Complications   HPI  Michele Mayo is a 31 y.o. female review on discharge summary had recent delivery of her first child.  Patient had pregnancy related hypertension concerns for preeclampsia.  Patient comes in today she has been experiencing fairly moderate to severe bitemporal headache for the last 2 days, proceeding that she had blurry vision.  No numbness or weakness.  She has been taking nifedipine but her blood pressures continue to be elevated at times over the 140 at home.  She did have swelling in her lower legs but this is resolved after delivery  She is breast-feeding.  No fevers.  Very mild amount of vaginal discharge she reports improving daily no abdominal pain except a few days ago she had a brief episode of right upper abdominal pain that resolved     Physical Exam   Triage Vital Signs: ED Triage Vitals  Enc Vitals Group     BP 12/26/21 1752 133/88     Pulse Rate 12/26/21 1752 96     Resp 12/26/21 1752 18     Temp 12/26/21 1752 98.5 F (36.9 C)     Temp src --      SpO2 12/26/21 1752 98 %     Weight 12/26/21 1743 150 lb (68 kg)     Height 12/26/21 1743 5\' 8"  (1.727 m)     Head Circumference --      Peak Flow --      Pain Score 12/26/21 1758 0     Pain Loc --      Pain Edu? --      Excl. in [place]? --     Most recent vital signs: Vitals:   12/26/21 1817 12/26/21 1830  BP:  (!) 129/92  Pulse: 80 93  Resp:  14  Temp:    SpO2: 94% 95%     General: Awake, no distress.  Pleasant.  Cranial nerve exam normal.  Normal sensation all extremities and face.  No pronator drift in extremity.  5-5 strength in all extremities.  Alert fully oriented.  Extraocular movements normal.  Denies any blurriness of vision or vision change at this time.  Does have a moderate headache on both  sides pointing towards her temple regions. CV:  Good peripheral perfusion.  Normal heart tones. Resp:  Normal effort.  Clear bilateral Abd:  No distention.  Soft nontender nondistended.  No right upper quadrant pain Other:  No peripheral edema No meningismus.  No nuchal rigidity.  ED Results / Procedures / Treatments   Labs (all labs ordered are listed, but only abnormal results are displayed) Labs Reviewed  COMPREHENSIVE METABOLIC PANEL - Abnormal; Notable for the following components:      Result Value   Glucose, Bld 100 (*)    All other components within normal limits  CBC WITH DIFFERENTIAL/PLATELET - Abnormal; Notable for the following components:   Hemoglobin 11.3 (*)    HCT 35.2 (*)    Abs Immature Granulocytes 0.16 (*)    All other components within normal limits  PROTEIN / CREATININE RATIO, URINE  PROTIME-INR  APTT    RADIOLOGY  CT head interpreted by me for acute gross pathology, no intracranial hemorrhage as denoted  CT Head Wo Contrast  Result Date: 12/26/2021 CLINICAL DATA:  Headache, tension-type post-partum, headache  EXAM: CT HEAD WITHOUT CONTRAST TECHNIQUE: Contiguous axial images were obtained from the base of the skull through the vertex without intravenous contrast. RADIATION DOSE REDUCTION: This exam was performed according to the departmental dose-optimization program which includes automated exposure control, adjustment of the mA and/or kV according to patient size and/or use of iterative reconstruction technique. COMPARISON:  None Available. FINDINGS: Brain: Normal anatomic configuration. No abnormal intra or extra-axial mass lesion or fluid collection. No abnormal mass effect or midline shift. No evidence of acute intracranial hemorrhage or infarct. Ventricular size is normal. Cerebellum unremarkable. Vascular: Unremarkable Skull: Intact Sinuses/Orbits: Paranasal sinuses are clear. Orbits are unremarkable. Other: Mastoid air cells and middle ear cavities are  clear. IMPRESSION: No acute intracranial abnormality. Normal examination. Electronically Signed   By: Fidela Salisbury M.D.   On: 12/26/2021 19:49    CT head negative for acute pathology   PROCEDURES:  Critical Care performed: No  Procedures   MEDICATIONS ORDERED IN ED: Medications  ibuprofen (ADVIL) tablet 600 mg (has no administration in time range)  labetalol (NORMODYNE) injection 20 mg (has no administration in time range)    And  labetalol (NORMODYNE) injection 40 mg (has no administration in time range)    And  labetalol (NORMODYNE) injection 80 mg (has no administration in time range)    And  hydrALAZINE (APRESOLINE) injection 10 mg (has no administration in time range)  magnesium bolus via infusion 4 g (has no administration in time range)  magnesium sulfate 40 grams in SWI 1000 mL OB infusion (has no administration in time range)  lactated ringers infusion (has no administration in time range)     IMPRESSION / MDM / ASSESSMENT AND PLAN / ED COURSE  I reviewed the triage vital signs and the nursing notes.                              Differential diagnosis includes, but is not limited to, possible tension type headache, possible preeclampsia, intracranial hemorrhage though seems unlikely based on exam but do wish to exclude this given the associated headache and recent delivery with a history of hypertension, migraines, thrombosis, etc.  Based on the patient's clinical history it seems she is suffering some type of hypertensive urgency in the setting of recent pregnancy.  Some improvement with improved edema, and also blurriness of vision has resolved but still having headaches and reporting elevated blood pressure at home despite use of nifedipine.  Her labs here are reassuring normal CBC except for mild anemia which is improving.  Normal metabolic panel including LFTs.  Normal platelet count.  I consulted with Dr. Leafy Ro of obstetrics.  She advised given the patient's  clinical history ongoing symptomatologies and headache that she would like to admit the patient.  Patient's presentation is most consistent with acute complicated illness / injury requiring diagnostic workup.  The patient is being admitted to the service of Dr. Leafy Ro.  The patient has been aware understanding of plan to go upstairs for further evaluation with Dr. Leafy Ro under her service  FINAL CLINICAL IMPRESSION(S) / ED DIAGNOSES   Final diagnoses:  Hypertensive urgency  Nonintractable headache, unspecified chronicity pattern, unspecified headache type     Rx / DC Orders   ED Discharge Orders     None        Note:  This document was prepared using Dragon voice recognition software and may include unintentional dictation errors.   Delman Kitten, MD 12/26/21  2021  

## 2021-12-26 NOTE — H&P (Signed)
PP History & Physical   History of Present Illness:  Chief Complaint:   HPI:  Michele Mayo is a 31 y.o. G1P1001 female at 10 days PP.  She presents to L&D with elevated BPs, HA, and blurred vision.  Iolanda delivered on 12/16/21, she was an IOL for [facility] and was discharged on Procardia XL 30mg  QD.     Pregnancy Issues: Gestational hypertension Pregnancy achieved with IVF Generalized anxiety disorder Rubella non-immune    Maternal Medical History:   Past Medical History:  Diagnosis Date   Herpes genitalis in women     Past Surgical History:  Procedure Laterality Date   NO PAST SURGERIES     WISDOM TOOTH EXTRACTION      No Known Allergies  Prior to Admission medications   Medication Sig Start Date End Date Taking? Authorizing Provider  acetaminophen (TYLENOL) 325 MG tablet Take 2 tablets (650 mg total) by mouth every 4 (four) hours as needed (for pain scale < 4). 12/18/21   McVey, Murray Hodgkins, CNM  benzocaine-Menthol (DERMOPLAST) 20-0.5 % AERO Apply 1 Application topically as needed for irritation (perineal discomfort). 12/18/21   McVey, Murray Hodgkins, CNM  coconut oil OIL Apply 1 Application topically as needed. 12/18/21   McVey, Murray Hodgkins, CNM  diphenhydrAMINE (BENADRYL) 25 mg capsule Take 1 capsule (25 mg total) by mouth every 6 (six) hours as needed for itching. 12/18/21   McVey, Murray Hodgkins, CNM  ferrous sulfate 325 (65 FE) MG tablet Take 1 tablet (325 mg total) by mouth 2 (two) times daily with a meal. 12/18/21   McVey, Murray Hodgkins, CNM  ibuprofen (ADVIL) 600 MG tablet Take 1 tablet (600 mg total) by mouth every 6 (six) hours. 12/18/21   McVey, Murray Hodgkins, CNM  NIFEdipine (ADALAT CC) 30 MG 24 hr tablet Take 1 tablet (30 mg total) by mouth daily. 12/19/21   McVey, Murray Hodgkins, CNM  Prenatal Vit-Fe Fumarate-FA (PRENATAL MULTIVITAMIN) TABS tablet Take 1 tablet by mouth daily at 12 noon.    [provider]  senna-docusate (SENOKOT-S) 8.6-50 MG tablet Take 2  tablets by mouth daily. 12/18/21   McVey, Murray Hodgkins, CNM  simethicone (MYLICON) 80 MG chewable tablet Chew 1 tablet (80 mg total) by mouth as needed for flatulence. 12/18/21   McVey, Murray Hodgkins, CNM  valACYclovir (VALTREX) 500 MG tablet Take 500 mg by mouth daily.    [provider]  witch hazel-glycerin (TUCKS) pad Apply 1 Application topically as needed for hemorrhoids. 12/18/21   McVey, Murray Hodgkins, CNM     Prenatal care site: [facility]   Social History: She  reports that she has never smoked. She has never used smokeless tobacco. She reports that she does not currently use alcohol.  Family History: Family history is unknown by patient.   Review of Systems: A full review of systems was performed and negative except as noted in the HPI.    Physical Exam:  Vital Signs: BP 136/74 (BP Location: Left Arm)   Pulse 79   Temp 97.7 F (36.5 C) (Oral)   Resp 19   Ht 5\' 8"  (1.727 m)   Wt 68 kg   LMP 03/21/2021 (Approximate)   SpO2 98%   Breastfeeding Yes   BMI 22.81 kg/m   General:   alert and cooperative  Skin:  normal  Neurologic:    Alert & oriented x 3  Lungs:    Nl effort  Heart:   regular rate and rhythm  Abdomen:  soft,  non-tender; bowel sounds normal; no masses,  no organomegaly  Extremities: : non-tender, symmetric, Mild edema bilaterally.      Results for orders placed or performed during the hospital encounter of 12/26/21 (from the past 24 hour(s))  Comprehensive metabolic panel     Status: Abnormal   Collection Time: 12/26/21  6:02 PM  Result Value Ref Range   Sodium 137 135 - 145 mmol/L   Potassium 4.0 3.5 - 5.1 mmol/L   Chloride 104 98 - 111 mmol/L   CO2 27 22 - 32 mmol/L   Glucose, Bld 100 (H) 70 - 99 mg/dL   BUN 18 6 - 20 mg/dL   Creatinine, Ser 0.61 0.44 - 1.00 mg/dL   Calcium 9.0 8.9 - 10.3 mg/dL   Total Protein 7.0 6.5 - 8.1 g/dL   Albumin 3.5 3.5 - 5.0 g/dL   AST 24 15 - 41 U/L   ALT 29 0 - 44 U/L   Alkaline Phosphatase 85 38 - 126  U/L   Total Bilirubin 0.6 0.3 - 1.2 mg/dL   GFR, Estimated >60 >60 mL/min   Anion gap 6 5 - 15  CBC with Differential     Status: Abnormal   Collection Time: 12/26/21  6:02 PM  Result Value Ref Range   WBC 9.3 4.0 - 10.5 K/uL   RBC 3.94 3.87 - 5.11 MIL/uL   Hemoglobin 11.3 (L) 12.0 - 15.0 g/dL   HCT 35.2 (L) 36.0 - 46.0 %   MCV 89.3 80.0 - 100.0 fL   MCH 28.7 26.0 - 34.0 pg   MCHC 32.1 30.0 - 36.0 g/dL   RDW 14.3 11.5 - 15.5 %   Platelets 375 150 - 400 K/uL   nRBC 0.0 0.0 - 0.2 %   Neutrophils Relative % 63 %   Neutro Abs 5.9 1.7 - 7.7 K/uL   Lymphocytes Relative 27 %   Lymphs Abs 2.5 0.7 - 4.0 K/uL   Monocytes Relative 6 %   Monocytes Absolute 0.5 0.1 - 1.0 K/uL   Eosinophils Relative 2 %   Eosinophils Absolute 0.2 0.0 - 0.5 K/uL   Basophils Relative 0 %   Basophils Absolute 0.0 0.0 - 0.1 K/uL   Immature Granulocytes 2 %   Abs Immature Granulocytes 0.16 (H) 0.00 - 0.07 K/uL  Protime-INR     Status: None   Collection Time: 12/26/21  9:20 PM  Result Value Ref Range   Prothrombin Time 13.2 11.4 - 15.2 seconds   INR 1.0 0.8 - 1.2  APTT     Status: None   Collection Time: 12/26/21  9:20 PM  Result Value Ref Range   aPTT 25 24 - 36 seconds  Protein / creatinine ratio, urine     Status: Abnormal   Collection Time: 12/26/21  9:36 PM  Result Value Ref Range   Creatinine, Urine 27 mg/dL   Total Protein, Urine 17 mg/dL   Protein Creatinine Ratio 0.63 (H) 0.00 - 0.15 mg/mg[Cre]      CT Head Wo Contrast  Result Date: 12/26/2021 CLINICAL DATA:  Headache, tension-type post-partum, headache EXAM: CT HEAD WITHOUT CONTRAST TECHNIQUE: Contiguous axial images were obtained from the base of the skull through the vertex without intravenous contrast. RADIATION DOSE REDUCTION: This exam was performed according to the departmental dose-optimization program which includes automated exposure control, adjustment of the mA and/or kV according to patient size and/or use of iterative  reconstruction technique. COMPARISON:  None Available. FINDINGS: Brain: Normal anatomic configuration. No abnormal intra or  extra-axial mass lesion or fluid collection. No abnormal mass effect or midline shift. No evidence of acute intracranial hemorrhage or infarct. Ventricular size is normal. Cerebellum unremarkable. Vascular: Unremarkable Skull: Intact Sinuses/Orbits: Paranasal sinuses are clear. Orbits are unremarkable. Other: Mastoid air cells and middle ear cavities are clear. IMPRESSION: No acute intracranial abnormality. Normal examination. Electronically Signed   By: Fidela Salisbury M.D.   On: 12/26/2021 19:49     Assessment:  Michele Mayo is a 31 y.o. G1P1001 female at 10 days PP with elevated BP, HA, burry vision.   Plan:  1. Admit to Labor & Delivery; consents reviewed and obtained   2. Elevated BPs - Initiate magnesium sulfate prophylaxis x 24 hours - Labetalol to be given for severe range BPs - Repeat labs in the am  [place], CNM 12/27/2021 12:01 AM

## 2021-12-27 DIAGNOSIS — O1415 Severe pre-eclampsia, complicating the puerperium: Secondary | ICD-10-CM | POA: Diagnosis present

## 2021-12-27 LAB — MAGNESIUM: Magnesium: 6.3 mg/dL (ref 1.7–2.4)

## 2021-12-27 MED ORDER — HYDROCORTISONE 1 % EX CREA
TOPICAL_CREAM | Freq: Four times a day (QID) | CUTANEOUS | Status: DC
  Filled 2021-12-27: qty 28

## 2021-12-27 MED ORDER — BREAST MILK/FORMULA (FOR LABEL PRINTING ONLY): ORAL | Status: DC

## 2021-12-27 NOTE — Progress Notes (Signed)
Michele Mayo did well on Magnesium tonight. She is very capable and stable to take care of the baby with FOB in the room. Michele Mayo breastfed the infant throughout the night. Her intake and output have been good. I gave her a breast pump and kit and set it up for her to use with chart labels and instructions on what to put on labels. Her neurological assessment is intact.

## 2021-12-27 NOTE — Discharge Summary (Signed)
Discharge Summary   Patient ID: Michele Mayo [id number] 31 y.o. [birth year]  Admit date: 12/26/2021  Discharge date and time: 12/27/2021  3:09 PM   Admitting Physician: Benjaman Kindler, MD   Discharge Physician: Benjaman Kindler, MD  Admission Diagnoses: Postpartum hypertension [O16.5] Hypertension in pregnancy, preeclampsia, severe, delivered/postpartum [O14.15]  Discharge Diagnoses: Same  Admission Condition: stable  Discharged Condition: good  Indication for Admission: PP pre-eclampsia with HA and blurry vision.  Hospital Course: Admitted for Magnesium Sulfate.  Labs completed.  12 hours of Mag completed with improved BPs and urine output.  Stable for discharge home.  Consults: None  Significant Diagnostic Studies:  Results for orders placed or performed during the hospital encounter of 12/26/21 (from the past 48 hour(s))  Comprehensive metabolic panel     Status: Abnormal   Collection Time: 12/26/21  6:02 PM  Result Value Ref Range   Sodium 137 135 - 145 mmol/L   Potassium 4.0 3.5 - 5.1 mmol/L   Chloride 104 98 - 111 mmol/L   CO2 27 22 - 32 mmol/L   Glucose, Bld 100 (H) 70 - 99 mg/dL    Comment: Glucose reference range applies only to samples taken after fasting for at least 8 hours.   BUN 18 6 - 20 mg/dL   Creatinine, Ser 0.61 0.44 - 1.00 mg/dL   Calcium 9.0 8.9 - 10.3 mg/dL   Total Protein 7.0 6.5 - 8.1 g/dL   Albumin 3.5 3.5 - 5.0 g/dL   AST 24 15 - 41 U/L   ALT 29 0 - 44 U/L   Alkaline Phosphatase 85 38 - 126 U/L   Total Bilirubin 0.6 0.3 - 1.2 mg/dL   GFR, Estimated >60 >60 mL/min    Comment: (NOTE) Calculated using the CKD-EPI Creatinine Equation (2021)    Anion gap 6 5 - 15    Comment: Performed at [facility], [address] [place], [place] [address]  CBC with Differential     Status: Abnormal   Collection Time: 12/26/21  6:02 PM  Result Value Ref Range   WBC 9.3 4.0 - 10.5 K/uL   RBC 3.94 3.87 - 5.11 MIL/uL    Hemoglobin 11.3 (L) 12.0 - 15.0 g/dL   HCT 35.2 (L) 36.0 - 46.0 %   MCV 89.3 80.0 - 100.0 fL   MCH 28.7 26.0 - 34.0 pg   MCHC 32.1 30.0 - 36.0 g/dL   RDW 14.3 11.5 - 15.5 %   Platelets 375 150 - 400 K/uL   nRBC 0.0 0.0 - 0.2 %   Neutrophils Relative % 63 %   Neutro Abs 5.9 1.7 - 7.7 K/uL   Lymphocytes Relative 27 %   Lymphs Abs 2.5 0.7 - 4.0 K/uL   Monocytes Relative 6 %   Monocytes Absolute 0.5 0.1 - 1.0 K/uL   Eosinophils Relative 2 %   Eosinophils Absolute 0.2 0.0 - 0.5 K/uL   Basophils Relative 0 %   Basophils Absolute 0.0 0.0 - 0.1 K/uL   Immature Granulocytes 2 %   Abs Immature Granulocytes 0.16 (H) 0.00 - 0.07 K/uL    Comment: Performed at [facility], [address] [place], [place] [address]  Protime-INR     Status: None   Collection Time: 12/26/21  9:20 PM  Result Value Ref Range   Prothrombin Time 13.2 11.4 - 15.2 seconds   INR 1.0 0.8 - 1.2    Comment: (NOTE) INR goal varies based on device and disease states. Performed at  [facility], [address] [place], [place] [address]   APTT     Status: None   Collection Time: 12/26/21  9:20 PM  Result Value Ref Range   aPTT 25 24 - 36 seconds    Comment: Performed at [facility], [address] [place], [place] [address]  Protein / creatinine ratio, urine     Status: Abnormal   Collection Time: 12/26/21  9:36 PM  Result Value Ref Range   Creatinine, Urine 27 mg/dL   Total Protein, Urine 17 mg/dL    Comment: NO NORMAL RANGE ESTABLISHED FOR THIS TEST   Protein Creatinine Ratio 0.63 (H) 0.00 - 0.15 mg/mg[Cre]    Comment: Performed at [facility], [address], [place], [place] [address]  Magnesium     Status: Abnormal   Collection Time: 12/27/21  7:21 AM  Result Value Ref Range   Magnesium 6.3 (HH) 1.7 - 2.4 mg/dL    Comment: CRITICAL RESULT CALLED TO, READ BACK BY AND VERIFIED WITH LETITIA ELKS @0802  12/27/21 MJU Performed at [facility], [address] [place], [place] [address]      Treatments: 12 hours Mag Sulfate  Discharge Exam: BP 130/83 (BP Location: Right Arm)   Pulse 88   Temp 97.9 F (36.6 C) (Oral)   Resp 18   Ht 5\' 8"  (1.727 m)   Wt 68 kg   LMP 03/21/2021 (Approximate)   SpO2 98%   Breastfeeding Yes   BMI 22.81 kg/m   General Appearance:    Alert, cooperative, no distress, appears stated age  Head:    Normocephalic, without obvious abnormality, atraumatic  Eyes:    PERRL, conjunctiva/corneas clear, EOM's intact, fundi    benign, both eyes  Ears:    Normal TM's and external ear canals, both ears  Nose:   Nares normal, septum midline, mucosa normal, no drainage    or sinus tenderness  Throat:   Lips, mucosa, and tongue normal; teeth and gums normal  Neck:   Supple, symmetrical, trachea midline, no adenopathy;    thyroid:  no enlargement/tenderness/nodules; no carotid   bruit or JVD  Back:     Symmetric, no curvature, ROM normal, no CVA tenderness  Lungs:     Clear to auscultation bilaterally, respirations unlabored  Chest Wall:    No tenderness or deformity   Heart:    Regular rate and rhythm, S1 and S2 normal, no murmur, rub   or gallop  Breast Exam:    No tenderness, masses, or nipple abnormality  Abdomen:     Soft, non-tender, bowel sounds active all four quadrants,    no masses, no organomegaly  Genitalia:    Normal female without lesion, discharge or tenderness  Rectal:    Normal tone, normal prostate, no masses or tenderness;   guaiac negative stool  Extremities:   Extremities normal, atraumatic, no cyanosis or edema  Pulses:   2+ and symmetric all extremities  Skin:   Skin color, texture, turgor normal, no rashes or lesions  Lymph nodes:   Cervical, supraclavicular, and axillary nodes normal  Neurologic:   CNII-XII intact, normal strength, sensation and reflexes    throughout    Disposition: Discharge disposition: 01-Home or Self Care       Patient Instructions:  Allergies as of 12/27/2021    No Known Allergies      Medication List     STOP taking these medications    NIFEdipine 30 MG 24 hr tablet  Commonly known as: ADALAT CC       TAKE these medications    acetaminophen 325 MG tablet Commonly known as: Tylenol Take 2 tablets (650 mg total) by mouth every 4 (four) hours as needed (for pain scale < 4).   benzocaine-Menthol 20-0.5 % Aero Commonly known as: DERMOPLAST Apply 1 Application topically as needed for irritation (perineal discomfort).   coconut oil Oil Apply 1 Application topically as needed.   diphenhydrAMINE 25 mg capsule Commonly known as: BENADRYL Take 1 capsule (25 mg total) by mouth every 6 (six) hours as needed for itching.   ferrous sulfate 325 (65 FE) MG tablet Take 1 tablet (325 mg total) by mouth 2 (two) times daily with a meal.   ibuprofen 600 MG tablet Commonly known as: ADVIL Take 1 tablet (600 mg total) by mouth every 6 (six) hours.   prenatal multivitamin Tabs tablet Take 1 tablet by mouth daily at 12 noon.   senna-docusate 8.6-50 MG tablet Commonly known as: Senokot-S Take 2 tablets by mouth daily.   simethicone 80 MG chewable tablet Commonly known as: MYLICON Chew 1 tablet (80 mg total) by mouth as needed for flatulence.   valACYclovir 500 MG tablet Commonly known as: VALTREX Take 500 mg by mouth daily.   witch hazel-glycerin pad Commonly known as: TUCKS Apply 1 Application topically as needed for hemorrhoids.       Activity: activity as tolerated Diet: regular diet Wound Care: none needed  Follow-up with [facility] in 2 days.  SignedClydene Laming 12/27/2021 10:49 PM

## 2022-01-11 MED FILL — Magnesium Sulfate IV Soln 40 GM/1000ML (40 MG/ML): INTRAVENOUS | Qty: 1000 | Status: AC

## 2023-08-19 ENCOUNTER — Emergency Department

## 2023-08-19 ENCOUNTER — Emergency Department
Admission: EM | Admit: 2023-08-19 | Discharge: 2023-08-19 | Disposition: A | Discharge status: Home / Self Care | Payer: Worker's Compensation | Attending: Emergency Medicine | Admitting: Emergency Medicine

## 2023-08-19 DIAGNOSIS — S51851A Open bite of right forearm, initial encounter: Secondary | ICD-10-CM | POA: Insufficient documentation

## 2023-08-19 DIAGNOSIS — S61451A Open bite of right hand, initial encounter: Secondary | ICD-10-CM | POA: Insufficient documentation

## 2023-08-19 DIAGNOSIS — O26891 Other specified pregnancy related conditions, first trimester: Secondary | ICD-10-CM | POA: Diagnosis present

## 2023-08-19 DIAGNOSIS — O9A211 Injury, poisoning and certain other consequences of external causes complicating pregnancy, first trimester: Secondary | ICD-10-CM | POA: Insufficient documentation

## 2023-08-19 DIAGNOSIS — S060X0A Concussion without loss of consciousness, initial encounter: Secondary | ICD-10-CM | POA: Insufficient documentation

## 2023-08-19 DIAGNOSIS — Y990 Civilian activity done for income or pay: Secondary | ICD-10-CM | POA: Insufficient documentation

## 2023-08-19 DIAGNOSIS — S51011A Laceration without foreign body of right elbow, initial encounter: Secondary | ICD-10-CM | POA: Insufficient documentation

## 2023-08-19 DIAGNOSIS — S0990XA Unspecified injury of head, initial encounter: Secondary | ICD-10-CM

## 2023-08-19 DIAGNOSIS — Z3A01 Less than 8 weeks gestation of pregnancy: Secondary | ICD-10-CM | POA: Insufficient documentation

## 2023-08-19 DIAGNOSIS — S301XXA Contusion of abdominal wall, initial encounter: Secondary | ICD-10-CM | POA: Diagnosis not present

## 2023-08-19 DIAGNOSIS — W503XXA Accidental bite by another person, initial encounter: Secondary | ICD-10-CM

## 2023-08-19 LAB — HCG, QUANTITATIVE, PREGNANCY: hCG, Beta Chain, Quant, S: 3482 m[IU]/mL — ABNORMAL HIGH (ref <5)

## 2023-08-19 NOTE — ED Notes (Signed)
 Patient on phone at this time. Will triage when off phone
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
## 2023-08-19 NOTE — ED Notes (Signed)
 Workers comp drug and alcohol screen completed in triage.
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
## 2023-09-23 DIAGNOSIS — O0993 Supervision of high risk pregnancy, unspecified, third trimester: Secondary | ICD-10-CM | POA: Insufficient documentation

## 2023-09-27 DIAGNOSIS — F419 Anxiety disorder, unspecified: Secondary | ICD-10-CM | POA: Insufficient documentation

## 2023-09-27 DIAGNOSIS — A6009 Herpesviral infection of other urogenital tract: Secondary | ICD-10-CM | POA: Insufficient documentation

## 2023-09-27 LAB — OB RESULTS CONSOLE RPR: RPR: NONREACTIVE

## 2023-09-27 LAB — OB RESULTS CONSOLE VARICELLA ZOSTER ANTIBODY, IGG: Varicella: IMMUNE

## 2023-09-27 LAB — OB RESULTS CONSOLE RUBELLA ANTIBODY, IGM: Rubella: NON-IMMUNE/NOT IMMUNE

## 2023-09-27 LAB — OB RESULTS CONSOLE GC/CHLAMYDIA
Chlamydia: NEGATIVE
Neisseria Gonorrhea: NEGATIVE

## 2023-09-27 LAB — OB RESULTS CONSOLE HEPATITIS B SURFACE ANTIGEN: Hepatitis B Surface Ag: NEGATIVE

## 2023-09-27 LAB — OB RESULTS CONSOLE HIV ANTIBODY (ROUTINE TESTING): HIV: NONREACTIVE

## 2023-09-27 LAB — HEPATITIS C ANTIBODY: HCV Ab: NEGATIVE

## 2023-11-28 DIAGNOSIS — O280 Abnormal hematological finding on antenatal screening of mother: Secondary | ICD-10-CM | POA: Insufficient documentation

## 2023-11-28 DIAGNOSIS — O09299 Supervision of pregnancy with other poor reproductive or obstetric history, unspecified trimester: Secondary | ICD-10-CM | POA: Insufficient documentation

## 2023-11-28 DIAGNOSIS — Z8759 Personal history of other complications of pregnancy, childbirth and the puerperium: Secondary | ICD-10-CM | POA: Insufficient documentation

## 2023-11-28 DIAGNOSIS — Z8679 Personal history of other diseases of the circulatory system: Secondary | ICD-10-CM | POA: Insufficient documentation

## 2024-01-31 LAB — OB RESULTS CONSOLE HIV ANTIBODY (ROUTINE TESTING): HIV: NONREACTIVE

## 2024-01-31 LAB — OB RESULTS CONSOLE RPR: RPR: NONREACTIVE

## 2024-02-21 ENCOUNTER — Other Ambulatory Visit: Payer: Self-pay | Admitting: Obstetrics and Gynecology

## 2024-02-21 DIAGNOSIS — R319 Hematuria, unspecified: Secondary | ICD-10-CM

## 2024-02-21 DIAGNOSIS — R102 Pelvic and perineal pain unspecified side: Secondary | ICD-10-CM

## 2024-02-22 ENCOUNTER — Ambulatory Visit
Admission: RE | Admit: 2024-02-22 | Discharge: 2024-02-22 | Disposition: A | Discharge status: Home / Self Care | Source: Ambulatory Visit | Attending: Obstetrics and Gynecology | Admitting: Obstetrics and Gynecology

## 2024-02-22 DIAGNOSIS — O26899 Other specified pregnancy related conditions, unspecified trimester: Secondary | ICD-10-CM | POA: Insufficient documentation

## 2024-02-22 DIAGNOSIS — R102 Pelvic and perineal pain unspecified side: Secondary | ICD-10-CM | POA: Insufficient documentation

## 2024-02-22 DIAGNOSIS — R319 Hematuria, unspecified: Secondary | ICD-10-CM | POA: Diagnosis present

## 2024-03-01 NOTE — L&D Delivery Note (Signed)
 Delivery Note  Michele Mayo is a [id number] at [redacted]w[redacted]d with an LMP of 08/26/23, consistent with US  at [redacted]w[redacted]d.   First Stage: Labor onset: 1730 Induction: oxytocin , AROM, and cervical balloon Analgesia /Anesthesia intrapartum: Epidural GBS: Negative/-- (01/28 0000)  IP Antibiotics: abx: none  Second Stage: Complete dilation at 0003 Onset of pushing at 0006 FHR second stage Baseline: 140 bpm decels with pushing   Michele Mayo presented to L&D for IOL for preeclampsia. She progressed  to C/C/+2 with a spontaneous urge to push.  She pushed  effectively over approximately 36 minutes for a spontaneous vaginal birth. Delivery of a viable baby girl on 04/03/2024 . by CNM. Delivery of fetal head in position: Occiput,, Anterior position with restitution to LOT with compound hand no nuchal cord;  Anterior then posterior shoulders delivered easily with gentle downward traction. Baby placed on mom's chest, and attended to by baby RN. Cord double clamped after cessation of pulsation, cut by FOB    Third Stage: Oxytocin  bolus started after delivery of placenta for hemorrhage prophylaxis, followed by TXA and Cytotec  Placenta delivered Keren intact with 3 VC @ 0054 Placenta disposition:.discarded per protocol To Pathology: No  Uterine tone firm / exam; vaginal bleeding: heavydue to lacerations  Laceration: 1st degree, 2nd degree, vaginal, labial, and periurethral laceration identified  Anesthesia for repair: procedures; anesthesia: local and epidural Repair suture type: 2.0 3.0 vicryl Est. Blood Loss (mL): 790  Complications:Diagnoses; OB-GYN delivery complications: none  Mom to postpartum.  Baby to Couplet care / Skin to Skin.  Newborn: Information for the patient's newborn:  Michele Mayo, Michele Mayo [[id number]]  Live born female  Birth Weight: 7 lb 0.2 oz (3180 g) APGAR: 8, 9  Newborn Delivery   Birth date/time: 04/03/2024 00:43:00 Delivery type: Vaginal, Spontaneous      Feeding  planned: breast feeding  ---------- Bobbette Brunswick, CNM Certified Nurse Midwife [facility]

## 2024-03-28 LAB — OB RESULTS CONSOLE GBS: GBS: NEGATIVE

## 2024-03-28 LAB — OB RESULTS CONSOLE GC/CHLAMYDIA
Chlamydia: NEGATIVE
Neisseria Gonorrhea: NEGATIVE

## 2024-03-28 NOTE — Progress Notes (Signed)
 Obstetrics & Gynecology Office Visit  Subjective  Michele Mayo is a 34 y.o. G3P1011 at [redacted]w[redacted]d being seen today for ongoing prenatal care.  She is currently monitored for tthis high-risk pregnancy.  Patient's last menstrual period was 07/21/2023. Estimated Date of Delivery: 04/19/24  History of Present Illness   Patient concerns today: doing well  Pt denies contractions, vaginal bleeding, leaking fluid. Endorses good fetal movementfrequent Pt denies HA, VD or RUQ pain.   Objective  BP 126/86   Pulse 77   Ht 177.8 cm (5' 10)   Wt 69.9 kg (154 lb)   LMP 07/21/2023   BMI 22.10 kg/m    Fetal Status:          Pre-pregnant weight: 61.2 kg (135 lb)  TWG: 8.618 kg (19 lb)  PE Chaperone note: A chaperone was included for sensitive portions of the exam- staff member name: D Corson CMA  Chaperone present for pelvic exam.  Gen: NAD  Pulm: No use of accessory muscles, normal respirations Abdomen: Gravid, nontender Ext : no edema, no rashes.   Psych: Mood, insight, judgement intact FHT by doppler: 143 bpm distinguished from mom Fundal height: 35 cm S<D  Assessment   33 y.o. G3P1011 at [redacted]w[redacted]d by  04/19/2024, by Ultrasound presenting for routine prenatal visit  Plan   Problem list reviewed and/or updated   Assessment & Plan     Orders Placed This Encounter  Procedures   Xpert CT/NG, PCR - [facility]    Release to patient:   Immediate   Strep Gp B NAA  - [facility]    Release to patient:   Immediate   US  FDC BPP without nonstress test (NST) each additional gestation    Standing Status:   Standing    Number of Occurrences:   3    Expiration Date:   05/26/2024    Reason for Exam: (Free Text):   IVF pregnancy    Release to patient:   Immediate   CBC w/auto Differential (5 Part)    Release to patient:   Immediate    1. Pregnancy, supervision, high-risk, third trimester (HHS-HCC) - -HSV prophylaxis indicated -GBS CT/GC collected.   -Planning family members: husband  for labor support;  -Labor plans reviewed.  Desires epidural, IVPM -Feeding preferences reviewed. Desires breast -Peds picked pediatrician: [facility] Kick counts reviewed with the patient in detail.  Patient instructed to assess for fetal activity daily at regular intervals.  Counts to be done if decreased activity noted.  Patient instructed to contact the office if counts do not reveal adequate movements. Labor precautions (ROM, vaginal bleeding, s/s labor) reviewed.  -     CBC w/auto Differential (5 Part) -     Xpert CT/NG, PCR - [facility] -     Strep Gp B NAA  - [facility] -     US  FDC BPP without nonstress test (NST) each additional gestation; Standing  2. 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 Anxiety in pregnancy, antepartum (HHS-HCC) -no medication  4. High risk pregnancy due to assisted reproductive technology in third trimester (HHS-HCC)  -     US  FDC BPP without nonstress test (NST) each additional gestation; Standing  5. History of postpartum pre-eclampsia F/u based on IVF plan Baseline Labs at [facility] Creatinine: wnl         PCR:                Liver transaminases: wnlPlatelets: 159 Start baby Aspirin  at 12 weeks [x]    6. History of delivery by vacuum extraction, currently pregnant (HHS-HCC) Failed 2nd stage, w/ SDJ Wants to discuss a primary c-section w/SDJ  7. Pelvic pain in pregnancy, antepartum (HHS-HCC) -recommend belly support band -pregnancy exercise  8. Swelling of vulva patient reports swelling intermittently throughout the day only on right side   9. Hematuria, unspecified type Flank pain and Blood in urine (neg urine culture) 12/23 per [facility] ok to order renal u.s to rule out kidney stones 02/22/24: Severe right sided hydronephrosis and mild left sided hydronephrosis. No  visualized nephrolithiasis. Distended urinary bladder with abnormal post void residual. Nonspecific echogenicities within the urinary  bladder, which may represent infectious debris or blood products. Correlation with urinalysis recommended.    Call with bleeding, contractions, PROM, or decreased fetal movement.   Return in about 1 week (around 04/04/2024) for Routine OB, Ultrasound(BPP).   Attestation Statement:   I personally performed the service, non-incident to. (WP)   FELICIA L DICKERSON, CNM

## 2024-04-02 ENCOUNTER — Encounter: Payer: Self-pay | Admitting: Obstetrics and Gynecology

## 2024-04-02 ENCOUNTER — Inpatient Hospital Stay
Admission: EM | Admit: 2024-04-02 | Discharge: 2024-04-04 | DRG: 806 | Disposition: A | Attending: Obstetrics | Admitting: Obstetrics and Gynecology

## 2024-04-02 ENCOUNTER — Inpatient Hospital Stay: Admitting: Anesthesiology

## 2024-04-02 ENCOUNTER — Other Ambulatory Visit: Payer: Self-pay

## 2024-04-02 DIAGNOSIS — D62 Acute posthemorrhagic anemia: Secondary | ICD-10-CM | POA: Diagnosis not present

## 2024-04-02 DIAGNOSIS — Y92009 Unspecified place in unspecified non-institutional (private) residence as the place of occurrence of the external cause: Secondary | ICD-10-CM

## 2024-04-02 DIAGNOSIS — O1404 Mild to moderate pre-eclampsia, complicating childbirth: Principal | ICD-10-CM | POA: Diagnosis present

## 2024-04-02 DIAGNOSIS — O9832 Other infections with a predominantly sexual mode of transmission complicating childbirth: Secondary | ICD-10-CM | POA: Diagnosis present

## 2024-04-02 DIAGNOSIS — O1493 Unspecified pre-eclampsia, third trimester: Secondary | ICD-10-CM | POA: Diagnosis present

## 2024-04-02 DIAGNOSIS — O9081 Anemia of the puerperium: Secondary | ICD-10-CM | POA: Diagnosis not present

## 2024-04-02 DIAGNOSIS — Z3A37 37 weeks gestation of pregnancy: Secondary | ICD-10-CM

## 2024-04-02 DIAGNOSIS — A6000 Herpesviral infection of urogenital system, unspecified: Secondary | ICD-10-CM | POA: Diagnosis present

## 2024-04-02 DIAGNOSIS — W19XXXA Unspecified fall, initial encounter: Principal | ICD-10-CM | POA: Diagnosis present

## 2024-04-02 DIAGNOSIS — Z7982 Long term (current) use of aspirin: Secondary | ICD-10-CM

## 2024-04-02 DIAGNOSIS — O368190 Decreased fetal movements, unspecified trimester, not applicable or unspecified: Secondary | ICD-10-CM | POA: Diagnosis present

## 2024-04-02 DIAGNOSIS — Y939 Activity, unspecified: Secondary | ICD-10-CM

## 2024-04-02 LAB — COMPREHENSIVE METABOLIC PANEL WITH GFR
ALT: 10 U/L (ref 0–44)
AST: 22 U/L (ref 15–41)
Albumin: 3.2 g/dL — ABNORMAL LOW (ref 3.5–5.0)
Alkaline Phosphatase: 177 U/L — ABNORMAL HIGH (ref 38–126)
Anion gap: 10 (ref 5–15)
BUN: 7 mg/dL (ref 6–20)
CO2: 22 mmol/L (ref 22–32)
Calcium: 8.6 mg/dL — ABNORMAL LOW (ref 8.9–10.3)
Chloride: 105 mmol/L (ref 98–111)
Creatinine, Ser: 0.56 mg/dL (ref 0.44–1.00)
GFR, Estimated: >60 mL/min
Glucose, Bld: 77 mg/dL (ref 70–99)
Potassium: 3.8 mmol/L (ref 3.5–5.1)
Sodium: 137 mmol/L (ref 135–145)
Total Bilirubin: 0.5 mg/dL (ref 0.0–1.2)
Total Protein: 6.2 g/dL — ABNORMAL LOW (ref 6.5–8.1)

## 2024-04-02 LAB — CBC
HCT: 35.8 % — ABNORMAL LOW (ref 36.0–46.0)
Hemoglobin: 11.9 g/dL — ABNORMAL LOW (ref 12.0–15.0)
MCH: 27.4 pg (ref 26.0–34.0)
MCHC: 33.2 g/dL (ref 30.0–36.0)
MCV: 82.3 fL (ref 80.0–100.0)
Platelets: 171 10*3/uL (ref 150–400)
RBC: 4.35 MIL/uL (ref 3.87–5.11)
RDW: 14 % (ref 11.5–15.5)
WBC: 6.1 10*3/uL (ref 4.0–10.5)
nRBC: 0 % (ref 0.0–0.2)

## 2024-04-02 LAB — URINALYSIS, ROUTINE W REFLEX MICROSCOPIC
Bilirubin Urine: NEGATIVE
Glucose, UA: NEGATIVE mg/dL
Ketones, ur: NEGATIVE mg/dL
Nitrite: NEGATIVE
Protein, ur: NEGATIVE mg/dL
Specific Gravity, Urine: 1.013 (ref 1.005–1.030)
WBC, UA: >50 WBC/hpf (ref 0–5)
pH: 7 (ref 5.0–8.0)

## 2024-04-02 LAB — WET PREP, GENITAL
Clue Cells Wet Prep HPF POC: NONE SEEN
Sperm: NONE SEEN
Trich, Wet Prep: NONE SEEN
WBC, Wet Prep HPF POC: <10
Yeast Wet Prep HPF POC: NONE SEEN

## 2024-04-02 LAB — PROTEIN / CREATININE RATIO, URINE
Creatinine, Urine: 82 mg/dL
Protein Creatinine Ratio: 0.3 mg/mg — ABNORMAL HIGH
Total Protein, Urine: 25 mg/dL

## 2024-04-02 LAB — RUPTURE OF MEMBRANE (ROM)PLUS: Rom Plus: NEGATIVE

## 2024-04-02 MED ORDER — ONDANSETRON HCL 4 MG/2ML IJ SOLN: 4.0000 mg | Freq: Four times a day (QID) | INTRAMUSCULAR | Status: DC | PRN

## 2024-04-02 MED ORDER — ACETAMINOPHEN 500 MG PO TABS
1000.0000 mg | ORAL_TABLET | Freq: Four times a day (QID) | ORAL | Status: DC | PRN
  Administered 2024-04-02 – 2024-04-03 (×2): 1000 mg via ORAL
  Filled 2024-04-02 (×2): qty 2

## 2024-04-02 MED ORDER — DIPHENHYDRAMINE HCL 50 MG/ML IJ SOLN: 12.5000 mg | INTRAMUSCULAR | Status: DC | PRN

## 2024-04-02 MED ORDER — MISOPROSTOL 200 MCG PO TABS
ORAL_TABLET | ORAL | Status: AC
  Administered 2024-04-03: 800 ug via RECTAL
  Filled 2024-04-02: qty 4

## 2024-04-02 MED ORDER — OXYTOCIN-SODIUM CHLORIDE 30-0.9 UT/500ML-% IV SOLN: 2.5000 [IU]/h | INTRAVENOUS | Status: DC

## 2024-04-02 MED ORDER — AMMONIA AROMATIC IN INHA
RESPIRATORY_TRACT | Status: AC
  Filled 2024-04-02: qty 10

## 2024-04-02 MED ORDER — LIDOCAINE HCL (PF) 1 % IJ SOLN
INTRAMUSCULAR | Status: AC
  Filled 2024-04-02: qty 30

## 2024-04-02 MED ORDER — LACTATED RINGERS IV SOLN: INTRAVENOUS | Status: DC

## 2024-04-02 MED ORDER — LACTATED RINGERS IV SOLN: 500.0000 mL | Freq: Once | INTRAVENOUS | Status: DC

## 2024-04-02 MED ORDER — MISOPROSTOL 25 MCG QUARTER TABLET
ORAL_TABLET | ORAL | Status: AC
  Filled 2024-04-02: qty 2

## 2024-04-02 MED ORDER — LIDOCAINE HCL (PF) 1 % IJ SOLN
30.0000 mL | INTRAMUSCULAR | Status: AC | PRN
  Administered 2024-04-03: 30 mL via SUBCUTANEOUS

## 2024-04-02 MED ORDER — MISOPROSTOL 25 MCG QUARTER TABLET: 25.0000 ug | ORAL_TABLET | ORAL | Status: DC

## 2024-04-02 MED ORDER — OXYTOCIN-SODIUM CHLORIDE 30-0.9 UT/500ML-% IV SOLN
INTRAVENOUS | Status: AC
  Filled 2024-04-02: qty 500

## 2024-04-02 MED ORDER — FENTANYL CITRATE (PF) 100 MCG/2ML IJ SOLN: 50.0000 ug | INTRAMUSCULAR | Status: DC | PRN

## 2024-04-02 MED ORDER — EPHEDRINE 5 MG/ML INJ: 10.0000 mg | INTRAVENOUS | Status: DC | PRN

## 2024-04-02 MED ORDER — LACTATED RINGERS IV SOLN: 500.0000 mL | INTRAVENOUS | Status: DC | PRN

## 2024-04-02 MED ORDER — OXYTOCIN BOLUS FROM INFUSION
333.0000 mL | Freq: Once | INTRAVENOUS | Status: AC
  Administered 2024-04-03: 333 mL via INTRAVENOUS

## 2024-04-02 MED ORDER — SOD CITRATE-CITRIC ACID 500-334 MG/5ML PO SOLN: 30.0000 mL | ORAL | Status: DC | PRN

## 2024-04-02 MED ORDER — PHENYLEPHRINE 80 MCG/ML (10ML) SYRINGE FOR IV PUSH (FOR BLOOD PRESSURE SUPPORT): 80.0000 ug | PREFILLED_SYRINGE | INTRAVENOUS | Status: DC | PRN

## 2024-04-02 MED ORDER — EPHEDRINE 5 MG/ML INJ
10.0000 mg | INTRAVENOUS | Status: DC | PRN
  Filled 2024-04-02: qty 5

## 2024-04-02 MED ORDER — ACETAMINOPHEN 325 MG PO TABS: 650.0000 mg | ORAL_TABLET | ORAL | Status: DC | PRN

## 2024-04-02 MED ORDER — LIDOCAINE HCL (PF) 1 % IJ SOLN
INTRAMUSCULAR | Status: DC | PRN
  Administered 2024-04-02: 1 mL

## 2024-04-02 MED ORDER — LIDOCAINE-EPINEPHRINE (PF) 1.5 %-1:200000 IJ SOLN
INTRAMUSCULAR | Status: DC | PRN
  Administered 2024-04-02: 3 mL via EPIDURAL

## 2024-04-02 MED ORDER — OXYTOCIN 10 UNIT/ML IJ SOLN
INTRAMUSCULAR | Status: AC
  Filled 2024-04-02: qty 2

## 2024-04-02 MED ORDER — OXYTOCIN-SODIUM CHLORIDE 30-0.9 UT/500ML-% IV SOLN
1.0000 m[IU]/min | INTRAVENOUS | Status: DC
  Administered 2024-04-02: 2 m[IU]/min via INTRAVENOUS

## 2024-04-02 MED ORDER — FENTANYL-BUPIVACAINE-NACL 0.5-0.125-0.9 MG/250ML-% EP SOLN
12.0000 mL/h | EPIDURAL | Status: DC | PRN
  Administered 2024-04-02: 12 mL/h via EPIDURAL
  Filled 2024-04-02: qty 250

## 2024-04-02 MED ORDER — TERBUTALINE SULFATE 1 MG/ML IJ SOLN: 0.2500 mg | Freq: Once | INTRAMUSCULAR | Status: DC | PRN

## 2024-04-02 NOTE — Anesthesia Procedure Notes (Signed)
 Epidural Patient location during procedure: OB Start time: 04/02/2024 7:41 PM End time: 04/02/2024 8:00 PM  Staffing Anesthesiologist: Vicci Camellia Glatter, MD Performed: anesthesiologist   Preanesthetic Checklist Completed: patient identified, IV checked, site marked, risks and benefits discussed, surgical consent, monitors and equipment checked, pre-op evaluation and timeout performed  Epidural Patient position: sitting Prep: ChloraPrep Patient monitoring: heart rate, continuous pulse ox and blood pressure Approach: midline Location: L3-L4 Injection technique: LOR saline  Needle:  Needle type: Tuohy  Needle gauge: 18 G Needle length: 9 cm Needle insertion depth: 5 cm Catheter type: closed end Catheter size: 20 Guage Catheter at skin depth: 10 cm Test dose: negative and 1.5% lidocaine  with Epi 1:200 K  Assessment Events: blood not aspirated, no cerebrospinal fluid, injection painful, no injection resistance and no paresthesia  Additional Notes Initial injection causes central lower back pain very short duration. Slower injection did not cause painReason for block:procedure for pain

## 2024-04-02 NOTE — Anesthesia Preprocedure Evaluation (Signed)
"                                    Anesthesia Evaluation  Patient identified by MRN, date of birth, ID band Patient awake    Reviewed: Allergy & Precautions, H&P , NPO status , Patient's Chart, lab work & pertinent test results  Airway Mallampati: II  TM Distance: >3 FB Neck ROM: full    Dental no notable dental hx.    Pulmonary neg pulmonary ROS   Pulmonary exam normal        Cardiovascular Exercise Tolerance: Good negative cardio ROS Normal cardiovascular exam     Neuro/Psych    GI/Hepatic negative GI ROS,,,  Endo/Other    Renal/GU   negative genitourinary   Musculoskeletal   Abdominal   Peds  Hematology negative hematology ROS (+)   Anesthesia Other Findings L&D for induction of labor due to Pre-eclamptic toxemia of pregnancy..  Past Medical History: No date: Herpes genitalis in women  Past Surgical History: No date: NO PAST SURGERIES No date: WISDOM TOOTH EXTRACTION  BMI    Body Mass Index: 22.24 kg/m      Reproductive/Obstetrics (+) Pregnancy                              Anesthesia Physical Anesthesia Plan  ASA: 3  Anesthesia Plan: Epidural   Post-op Pain Management:    Induction:   PONV Risk Score and Plan:   Airway Management Planned:   Additional Equipment:   Intra-op Plan:   Post-operative Plan:   Informed Consent: I have reviewed the patients History and Physical, chart, labs and discussed the procedure including the risks, benefits and alternatives for the proposed anesthesia with the patient or authorized representative who has indicated his/her understanding and acceptance.       Plan Discussed with: Anesthesiologist and CRNA  Anesthesia Plan Comments:          Anesthesia Quick Evaluation  "

## 2024-04-03 ENCOUNTER — Encounter: Payer: Self-pay | Admitting: Obstetrics and Gynecology

## 2024-04-03 LAB — CBC
HCT: 34.3 % — ABNORMAL LOW (ref 36.0–46.0)
HCT: 32.5 % — ABNORMAL LOW (ref 36.0–46.0)
Hemoglobin: 11.2 g/dL — ABNORMAL LOW (ref 12.0–15.0)
Hemoglobin: 10.9 g/dL — ABNORMAL LOW (ref 12.0–15.0)
MCH: 27.2 pg (ref 26.0–34.0)
MCH: 27.6 pg (ref 26.0–34.0)
MCHC: 32.7 g/dL (ref 30.0–36.0)
MCHC: 33.5 g/dL (ref 30.0–36.0)
MCV: 83.3 fL (ref 80.0–100.0)
MCV: 82.3 fL (ref 80.0–100.0)
Platelets: 170 10*3/uL (ref 150–400)
Platelets: 156 10*3/uL (ref 150–400)
RBC: 4.12 MIL/uL (ref 3.87–5.11)
RBC: 3.95 MIL/uL (ref 3.87–5.11)
RDW: 14.1 % (ref 11.5–15.5)
RDW: 13.9 % (ref 11.5–15.5)
WBC: 12.5 10*3/uL — ABNORMAL HIGH (ref 4.0–10.5)
WBC: 10.4 10*3/uL (ref 4.0–10.5)
nRBC: 0 % (ref 0.0–0.2)
nRBC: 0 % (ref 0.0–0.2)

## 2024-04-03 LAB — SYPHILIS: RPR W/REFLEX TO RPR TITER AND TREPONEMAL ANTIBODIES, TRADITIONAL SCREENING AND DIAGNOSIS ALGORITHM: RPR Ser Ql: NONREACTIVE

## 2024-04-03 MED ORDER — SODIUM CHLORIDE 0.9% FLUSH: 3.0000 mL | Freq: Two times a day (BID) | INTRAVENOUS | Status: DC

## 2024-04-03 MED ORDER — ZOLPIDEM TARTRATE 5 MG PO TABS: 5.0000 mg | ORAL_TABLET | Freq: Every evening | ORAL | Status: DC | PRN

## 2024-04-03 MED ORDER — FERROUS SULFATE 325 (65 FE) MG PO TABS
325.0000 mg | ORAL_TABLET | Freq: Two times a day (BID) | ORAL | Status: DC
  Administered 2024-04-03 – 2024-04-04 (×3): 325 mg via ORAL
  Filled 2024-04-03 (×3): qty 1

## 2024-04-03 MED ORDER — DIBUCAINE (PERIANAL) 1 % EX OINT
1.0000 | TOPICAL_OINTMENT | CUTANEOUS | Status: DC | PRN
  Filled 2024-04-03 (×5): qty 28

## 2024-04-03 MED ORDER — SODIUM CHLORIDE 0.9% FLUSH: 3.0000 mL | INTRAVENOUS | Status: DC | PRN

## 2024-04-03 MED ORDER — METHYLERGONOVINE MALEATE 0.2 MG PO TABS: 0.2000 mg | ORAL_TABLET | ORAL | Status: DC | PRN

## 2024-04-03 MED ORDER — COCONUT OIL OIL
1.0000 | TOPICAL_OIL | Status: DC | PRN
  Filled 2024-04-03: qty 7.5

## 2024-04-03 MED ORDER — ACETAMINOPHEN 325 MG PO TABS
650.0000 mg | ORAL_TABLET | ORAL | Status: DC | PRN
  Administered 2024-04-03 – 2024-04-04 (×5): 650 mg via ORAL
  Filled 2024-04-03 (×6): qty 2

## 2024-04-03 MED ORDER — OXYCODONE HCL 5 MG PO TABS: 5.0000 mg | ORAL_TABLET | ORAL | Status: DC | PRN

## 2024-04-03 MED ORDER — SIMETHICONE 80 MG PO CHEW: 80.0000 mg | CHEWABLE_TABLET | ORAL | Status: DC | PRN

## 2024-04-03 MED ORDER — BISACODYL 10 MG RE SUPP: 10.0000 mg | Freq: Every day | RECTAL | Status: DC | PRN

## 2024-04-03 MED ORDER — ONDANSETRON HCL 4 MG/2ML IJ SOLN: 4.0000 mg | INTRAMUSCULAR | Status: DC | PRN

## 2024-04-03 MED ORDER — TRANEXAMIC ACID-NACL 1000-0.7 MG/100ML-% IV SOLN
INTRAVENOUS | Status: AC
  Administered 2024-04-03: 1000 mg via INTRAVENOUS
  Filled 2024-04-03: qty 100

## 2024-04-03 MED ORDER — FLEET ENEMA RE ENEM: 1.0000 | ENEMA | Freq: Every day | RECTAL | Status: DC | PRN

## 2024-04-03 MED ORDER — ONDANSETRON HCL 4 MG PO TABS: 4.0000 mg | ORAL_TABLET | ORAL | Status: DC | PRN

## 2024-04-03 MED ORDER — METHYLERGONOVINE MALEATE 0.2 MG/ML IJ SOLN: 0.2000 mg | INTRAMUSCULAR | Status: DC | PRN

## 2024-04-03 MED ORDER — WITCH HAZEL-GLYCERIN EX PADS
1.0000 | MEDICATED_PAD | CUTANEOUS | Status: DC | PRN
  Filled 2024-04-03 (×5): qty 100

## 2024-04-03 MED ORDER — BENZOCAINE-MENTHOL 20-0.5 % EX AERO
1.0000 | INHALATION_SPRAY | CUTANEOUS | Status: DC | PRN
  Administered 2024-04-03: 1 via TOPICAL
  Filled 2024-04-03 (×5): qty 56

## 2024-04-03 MED ORDER — PRENATAL MULTIVITAMIN CH
1.0000 | ORAL_TABLET | Freq: Every day | ORAL | Status: DC
  Administered 2024-04-03 – 2024-04-04 (×2): 1 via ORAL
  Filled 2024-04-03 (×2): qty 1

## 2024-04-03 MED ORDER — DIPHENHYDRAMINE HCL 25 MG PO CAPS: 25.0000 mg | ORAL_CAPSULE | Freq: Four times a day (QID) | ORAL | Status: DC | PRN

## 2024-04-03 MED ORDER — SODIUM CHLORIDE 0.9 % IV SOLN: 250.0000 mL | INTRAVENOUS | Status: DC | PRN

## 2024-04-03 MED ORDER — IBUPROFEN 600 MG PO TABS
600.0000 mg | ORAL_TABLET | Freq: Four times a day (QID) | ORAL | Status: DC
  Administered 2024-04-03 – 2024-04-04 (×6): 600 mg via ORAL
  Filled 2024-04-03 (×6): qty 1

## 2024-04-03 MED ORDER — SENNOSIDES-DOCUSATE SODIUM 8.6-50 MG PO TABS
2.0000 | ORAL_TABLET | ORAL | Status: DC
  Administered 2024-04-03 – 2024-04-04 (×2): 2 via ORAL
  Filled 2024-04-03 (×2): qty 2

## 2024-04-03 NOTE — Lactation Note (Signed)
 This note was copied from a baby's chart. Lactation Consultation Note  Patient Name: Michele Mayo Unijb'd Date: 04/03/2024 Age:32 hours Reason for consult: Mother's request   Maternal Data  Pt requested lactation because infant [name] was not latching, and was falling asleep at the breast. Upon arrival, pt informed lactation that [facility] hadn't experienced a dirty diaper yet. Pt also informed us  that her left breast hurt to breastfeed on, and was bleeding.  Breasts looked normal, but some bleeding was observed on the left nipple. The right nipple and breast appeared normal.  Feeding  In order to rouse infant, pt mother-in-law was instructed to remove the infants clothes. This resulted in crying, which allowed a feed to be initiated. When latched on the right side, pt reported some pinching initially, but eventually reported no pain. The latch started out shallow, but with minor adjustments we were able to get a deeper latch. [name] was sleepy at the breast, but with some cheek stroking she was able to get a few swallows. Pt switched breasts after 10 minutes, and fed on the left side. Pt reported more pain, but was able to get a deeper latch.  LATCH Score Latch: Repeated attempts needed to sustain latch, nipple held in mouth throughout feeding, stimulation needed to elicit sucking reflex.  Audible Swallowing: A few with stimulation  Type of Nipple: Everted at rest and after stimulation  Comfort (Breast/Nipple): Engorged, cracked, bleeding, large blisters, severe discomfort  Hold (Positioning): Assistance needed to correctly position infant at breast and maintain latch.  LATCH Score: 5  Lactation Tools Discussed/Used  Lactation provided education on how to use and clean comfort gels. Also answered questions regarding coconut oil usage.  Interventions Interventions: Coconut oil;Comfort gels;Assisted with latch   Consult Status Consult Status: Follow-up Date:  04/03/24 Follow-up type: In-patient    [name] 04/03/2024, 3:07 PM

## 2024-04-03 NOTE — Lactation Note (Signed)
 This note was copied from a baby's chart. Lactation Consultation Note  Patient Name: Girl Janecia Palau Today's Date: 04/03/2024 Age:33 hours Reason for consult: Initial assessment;Early term 37-38.6wks   Maternal Data Does the patient have breastfeeding experience prior to this delivery?: Yes How long did the patient breastfeed?: 69yr  Initial assessment w/ a 8hr old baby girl Stevie and parents.  This was a SVD, and moms 2nd baby.  Patient w/ anxiety, gHTN, and pre-eclampsia in her 3rd trimester.  Mom stated that she didn't have any challenges with breastfeeding her first child.  She has a Spectra  pump at home.  Feeding Mother's Current Feeding Choice: Breast Milk  Infant feeding on breast upon entry into the room.  Mom concerned because during the feeding, infant blew out some mucous.  Latch looked good, infant only doing non-nutritive sucking at the time.  LATCH Score Latch: Grasps breast easily, tongue down, lips flanged, rhythmical sucking.  Audible Swallowing: None  Type of Nipple: Everted at rest and after stimulation  Comfort (Breast/Nipple): Soft / non-tender  Hold (Positioning): No assistance needed to correctly position infant at breast.  LATCH Score: 8  Interventions Interventions: Breast feeding basics reviewed;Education  LC provided education on the following;  milk production expectations, hunger cues, day 1/2 wet/dirty diapers, hand expression, cluster feeding, benefits of STS and arousing infant for a feeding.  Lactation informed patient of feeding infant at least 8 or more times w/in a 24hr period but not exceeding 3hrs. Patient verbalized understanding.   Discharge Pump: Personal;DEBP (Spectra ) WIC Program: No  Consult Status Consult Status: Follow-up Follow-up type: In-patient    Keara Pagliarulo S Maylie Ashton 04/03/2024, 9:37 AM

## 2024-04-03 NOTE — Anesthesia Postprocedure Evaluation (Signed)
"   Anesthesia Post Note  Patient: Michele Mayo  Procedure(s) Performed: AN AD HOC LABOR EPIDURAL  Patient location during evaluation: Mother Baby Anesthesia Type: Epidural Level of consciousness: awake and alert Pain management: pain level controlled Vital Signs Assessment: post-procedure vital signs reviewed and stable Respiratory status: spontaneous breathing, nonlabored ventilation and respiratory function stable Cardiovascular status: stable Postop Assessment: no headache, no backache and epidural receding Anesthetic complications: no   No notable events documented.   Last Vitals:  Vitals:   04/03/24 0432 04/03/24 0510  BP: 123/75 129/82  Pulse: 63 63  Resp:  16  Temp:  37.3 C  SpO2:  99%    Last Pain:  Vitals:   04/03/24 0607  TempSrc:   PainSc: 5                  Jamonta Goerner,  Delon HERO      "

## 2024-04-03 NOTE — Progress Notes (Signed)
 Postpartum Day  0  Subjective: 34 y.o. H6E7987 postpartum day # 0 status post normal spontaneous vaginal delivery. She is ambulating, is tolerating po, is voiding spontaneously.  Her pain is well controlled on PO pain medications. Her lochia is less than menses.  Objective: BP 125/60 (BP Location: Right Arm)   Pulse 67   Temp 98 F (36.7 C) (Oral)   Resp 18   Ht 5' 10 (1.778 m)   Wt 70.3 kg   SpO2 98%   Breastfeeding Unknown   BMI 22.24 kg/m    Physical Exam:  General: alert, cooperative, and appears stated age Breasts: soft/nontender Pulm: nl effort Abdomen: soft, non-tender, active bowel sounds Uterine Fundus: firm Perineum: moderate edema, repair well approximated, significant swelling noted to vulva, denies any pain, flesh colored and does not appear to be a hematoma.  Lochia: appropriate DVT Evaluation: No evidence of DVT seen on physical exam. Negative Homan's sign. No cords or calf tenderness. No significant calf/ankle edema.  Recent Labs    04/03/24 0602 04/03/24 1011  HGB 11.2* 10.9*  HCT 34.3* 32.5*  WBC 12.5* 10.4  PLT 170 156    Assessment/Plan: 34 y.o. H6E7987 Postpartum Day  0  1. Continue routine postpartum care  2. Infant feeding status: breast feeding --Lactation consult PRN for breastfeeding   3. Contraception plan: natural family planning (NFP)  4. Acute blood loss anemia - clinically significant.  --Hemodynamically stable and asymptomatic --Intervention: continue on oral supplementation with ferrous sulfate  325  5. Immunization status:   does not vaccinate   6. Vulvar swelling: apply real ice pack to bottom  Disposition: continue inpatient postpartum care    LOS: 1 day   Jacqeline Broers, CNM 04/03/2024, 12:44 PM   ----- Bobbette Brunswick Certified Nurse Midwife [facility]

## 2024-04-04 LAB — TYPE AND SCREEN
ABO/RH(D): A POS
Antibody Screen: NEGATIVE

## 2024-04-04 NOTE — Progress Notes (Signed)
 Pt discharged with infant.  Discharge instructions, prescriptions and follow up appointment given to and reviewed with pt. Pt verbalized understanding. Escorted out by auxillary.
